# Patient Record
Sex: Male | Born: 1955 | Race: White | Hispanic: No | Marital: Married | Smoking: Light tobacco smoker
Health system: Southern US, Community
[De-identification: ages and names within clinical notes are randomized; demographics above are authoritative.]

## PROBLEM LIST (undated history)

## (undated) DIAGNOSIS — J302 Other seasonal allergic rhinitis: Secondary | ICD-10-CM

## (undated) DIAGNOSIS — K219 Gastro-esophageal reflux disease without esophagitis: Secondary | ICD-10-CM

## (undated) DIAGNOSIS — E785 Hyperlipidemia, unspecified: Secondary | ICD-10-CM

## (undated) HISTORY — DX: Hyperlipidemia, unspecified: E78.5

## (undated) HISTORY — DX: Other seasonal allergic rhinitis: J30.2

## (undated) HISTORY — PX: NASAL SINUS SURGERY: SHX719

---

## 1985-10-30 HISTORY — PX: NASAL SINUS SURGERY: SHX719

## 2005-07-26 ENCOUNTER — Ambulatory Visit (HOSPITAL_COMMUNITY): Admission: RE | Admit: 2005-07-26 | Discharge: 2005-07-26 | Payer: Self-pay | Admitting: Gastroenterology

## 2016-04-20 ENCOUNTER — Ambulatory Visit: Payer: Self-pay | Admitting: Podiatry

## 2016-08-09 ENCOUNTER — Other Ambulatory Visit: Payer: Self-pay | Admitting: Gastroenterology

## 2016-09-06 ENCOUNTER — Encounter (HOSPITAL_COMMUNITY): Payer: Self-pay | Admitting: *Deleted

## 2016-09-12 ENCOUNTER — Encounter (HOSPITAL_COMMUNITY)
Admission: RE | Disposition: A | Discharge status: Home / Self Care | Payer: Self-pay | Source: Ambulatory Visit | Attending: Gastroenterology

## 2016-09-12 ENCOUNTER — Ambulatory Visit (HOSPITAL_COMMUNITY)
Admission: RE | Admit: 2016-09-12 | Discharge: 2016-09-12 | Disposition: A | Discharge status: Home / Self Care | Payer: BLUE CROSS/BLUE SHIELD | Source: Ambulatory Visit | Attending: Gastroenterology | Admitting: Gastroenterology

## 2016-09-12 ENCOUNTER — Ambulatory Visit (HOSPITAL_COMMUNITY): Payer: BLUE CROSS/BLUE SHIELD | Admitting: Anesthesiology

## 2016-09-12 ENCOUNTER — Encounter (HOSPITAL_COMMUNITY): Payer: Self-pay | Admitting: *Deleted

## 2016-09-12 DIAGNOSIS — E7800 Pure hypercholesterolemia, unspecified: Secondary | ICD-10-CM | POA: Insufficient documentation

## 2016-09-12 DIAGNOSIS — K5730 Diverticulosis of large intestine without perforation or abscess without bleeding: Secondary | ICD-10-CM | POA: Diagnosis not present

## 2016-09-12 DIAGNOSIS — Z1211 Encounter for screening for malignant neoplasm of colon: Secondary | ICD-10-CM | POA: Insufficient documentation

## 2016-09-12 DIAGNOSIS — K219 Gastro-esophageal reflux disease without esophagitis: Secondary | ICD-10-CM | POA: Diagnosis not present

## 2016-09-12 DIAGNOSIS — D122 Benign neoplasm of ascending colon: Secondary | ICD-10-CM | POA: Diagnosis not present

## 2016-09-12 HISTORY — PX: COLONOSCOPY WITH PROPOFOL: SHX5780

## 2016-09-12 HISTORY — DX: Gastro-esophageal reflux disease without esophagitis: K21.9

## 2016-09-12 SURGERY — COLONOSCOPY WITH PROPOFOL
Anesthesia: Monitor Anesthesia Care

## 2016-09-12 MED ORDER — PROPOFOL 10 MG/ML IV BOLUS
INTRAVENOUS | Status: AC
  Filled 2016-09-12: qty 40

## 2016-09-12 MED ORDER — LACTATED RINGERS IV SOLN
INTRAVENOUS | Status: DC
  Administered 2016-09-12: 1000 mL via INTRAVENOUS

## 2016-09-12 MED ORDER — LIDOCAINE 2% (20 MG/ML) 5 ML SYRINGE
INTRAMUSCULAR | Status: AC
  Filled 2016-09-12: qty 5

## 2016-09-12 MED ORDER — LIDOCAINE 2% (20 MG/ML) 5 ML SYRINGE
INTRAMUSCULAR | Status: DC | PRN
  Administered 2016-09-12: 60 mg via INTRAVENOUS

## 2016-09-12 MED ORDER — PROPOFOL 500 MG/50ML IV EMUL
INTRAVENOUS | Status: DC | PRN
  Administered 2016-09-12: 150 ug/kg/min via INTRAVENOUS

## 2016-09-12 MED ORDER — SODIUM CHLORIDE 0.9 % IV SOLN: INTRAVENOUS | Status: DC

## 2016-09-12 MED ORDER — PROPOFOL 10 MG/ML IV BOLUS
INTRAVENOUS | Status: DC | PRN
  Administered 2016-09-12 (×2): 30 mg via INTRAVENOUS

## 2016-09-12 SURGICAL SUPPLY — 22 items

## 2016-09-12 NOTE — H&P (Signed)
Procedure: Screening colonoscopy. Normal baseline screening colonoscopy was performed on 07/26/2005  History: The patient is a 60 year old male born August 20, 1956. He is scheduled to undergo a repeat screening colonoscopy today  Medication allergies: None  Past medical history: Allergic rhinitis. Hypercholesterolemia. Sinus surgery.  Exam: The patient is alert and lying comfortably on the endoscopy stretcher. Abdomen is soft and nontender to palpation. Cardiac exam reveals a regular rhythm. Lungs are clear to auscultation.  Plan: Proceed with screening colonoscopy

## 2016-09-12 NOTE — Anesthesia Preprocedure Evaluation (Signed)
Anesthesia Evaluation  Patient identified by MRN, date of birth, ID band Patient awake    Reviewed: Allergy & Precautions, NPO status , Patient's Chart, lab work & pertinent test results  Airway Mallampati: II   Neck ROM: full    Dental   Pulmonary Current Smoker,    breath sounds clear to auscultation       Cardiovascular negative cardio ROS   Rhythm:regular Rate:Normal     Neuro/Psych    GI/Hepatic GERD  ,  Endo/Other    Renal/GU      Musculoskeletal   Abdominal   Peds  Hematology   Anesthesia Other Findings   Reproductive/Obstetrics                             Anesthesia Physical Anesthesia Plan  ASA: II  Anesthesia Plan: MAC   Post-op Pain Management:    Induction: Intravenous  Airway Management Planned: Simple Face Mask  Additional Equipment:   Intra-op Plan:   Post-operative Plan:   Informed Consent: I have reviewed the patients History and Physical, chart, labs and discussed the procedure including the risks, benefits and alternatives for the proposed anesthesia with the patient or authorized representative who has indicated his/her understanding and acceptance.     Plan Discussed with: CRNA, Anesthesiologist and Surgeon  Anesthesia Plan Comments:         Anesthesia Quick Evaluation

## 2016-09-12 NOTE — Discharge Instructions (Signed)

## 2016-09-12 NOTE — Anesthesia Postprocedure Evaluation (Signed)
Anesthesia Post Note  Patient: Brendan Erickson  Procedure(s) Performed: Procedure(s) (LRB): COLONOSCOPY WITH PROPOFOL (N/A)  Patient location during evaluation: PACU Anesthesia Type: MAC Level of consciousness: awake and alert Pain management: pain level controlled Vital Signs Assessment: post-procedure vital signs reviewed and stable Respiratory status: spontaneous breathing, nonlabored ventilation, respiratory function stable and patient connected to nasal cannula oxygen Cardiovascular status: stable and blood pressure returned to baseline Anesthetic complications: no    Last Vitals:  Vitals:   09/12/16 1128 09/12/16 1341  BP: (!) 154/68 (!) 107/59  Pulse: (!) 53 (!) 51  Resp: 18 16  Temp: 36.9 C     Last Pain:  Vitals:   09/12/16 1128  TempSrc: Oral                 [name]

## 2016-09-12 NOTE — Transfer of Care (Signed)
Immediate Anesthesia Transfer of Care Note  Patient: Brendan Erickson  Procedure(s) Performed: Procedure(s): COLONOSCOPY WITH PROPOFOL (N/A)  Patient Location: PACU and Endoscopy Unit  Anesthesia Type:MAC  Level of Consciousness: sedated and patient cooperative  Airway & Oxygen Therapy: Patient Spontanous Breathing and Patient connected to face mask oxygen  Post-op Assessment: Report given to RN and Post -op Vital signs reviewed and stable  Post vital signs: Reviewed and stable  Last Vitals:  Vitals:   09/12/16 1128  BP: (!) 154/68  Pulse: (!) 53  Resp: 18  Temp: 36.9 C    Last Pain:  Vitals:   09/12/16 1128  TempSrc: Oral         Complications: No apparent anesthesia complications

## 2016-09-12 NOTE — Op Note (Signed)
[facility] Patient Name: Brendan Erickson Procedure Date: 09/12/2016 MRN: [id number] Attending MD: Garlan Fair , MD Date of Birth: 11-29-1955 CSN: [id number] Age: 60 Admit Type: Outpatient Procedure:                Colonoscopy Indications:              Screening for colorectal malignant neoplasm Providers:                Garlan Fair, MD, Laverta Baltimore RN, RN,                            Ralene Bathe, Technician Referring MD:              Medicines:                Propofol per Anesthesia Complications:            No immediate complications. Estimated Blood Loss:     Estimated blood loss: none. Procedure:                Pre-Anesthesia Assessment:                           - Prior to the procedure, a History and Physical                            was performed, and patient medications and                            allergies were reviewed. The patient's tolerance of                            previous anesthesia was also reviewed. The risks                            and benefits of the procedure and the sedation                            options and risks were discussed with the patient.                            All questions were answered, and informed consent                            was obtained. Prior Anticoagulants: The patient has                            taken aspirin, last dose was 1 day prior to                            procedure. ASA Grade Assessment: II - A patient                            with mild systemic disease. After reviewing the  risks and benefits, the patient was deemed in                            satisfactory condition to undergo the procedure.                           After obtaining informed consent, the colonoscope                            was passed under direct vision. Throughout the                            procedure, the patient's blood pressure, pulse, and   oxygen saturations were monitored continuously. The                            [id number] scope was introduced through                            the anus and advanced to the the cecum, identified                            by appendiceal orifice and ileocecal valve. The                            colonoscopy was performed without difficulty. The                            patient tolerated the procedure well. The quality                            of the bowel preparation was good. The appendiceal                            orifice and the rectum were photographed. Scope In: 1:12:10 PM Scope Out: 1:36:41 PM Scope Withdrawal Time: 0 hours 14 minutes 10 seconds  Total Procedure Duration: 0 hours 24 minutes 31 seconds  Findings:      The perianal and digital rectal examinations were normal.      A 5 mm polyp was found in the proximal ascending colon. The polyp was       sessile. The polyp was removed with a cold snare. Resection and       retrieval were complete.      Multiple small and large-mouthed diverticula were found in the sigmoid       colon and ascending colon.      The exam was otherwise without abnormality. Impression:               - One 5 mm polyp in the proximal ascending colon,                            removed with a cold snare. Resected and retrieved.                           - Diverticulosis in the  sigmoid colon and in the                            ascending colon.                           - The examination was otherwise normal. Moderate Sedation:      N/A- Per Anesthesia Care      N/A- Per Anesthesia Care Recommendation:           - Patient has a contact number available for                            emergencies. The signs and symptoms of potential                            delayed complications were discussed with the                            patient. Return to normal activities tomorrow.                            Written discharge instructions were  provided to the                            patient.                           - Repeat colonoscopy date to be determined after                            pending pathology results are reviewed for                            screening purposes.                           - Continue present medications.                           - Resume previous diet. Procedure Code(s):        --- Professional ---                           [phone], Colonoscopy, flexible; with removal of                            tumor(s), polyp(s), or other lesion(s) by snare                            technique Diagnosis Code(s):        --- Professional ---                           Z12.11, Encounter for screening for malignant  neoplasm of colon                           D12.2, Benign neoplasm of ascending colon                           K57.30, Diverticulosis of large intestine without                            perforation or abscess without bleeding CPT copyright 2016 American Medical Association. All rights reserved. The codes documented in this report are preliminary and upon coder review may  be revised to meet current compliance requirements. Earle Gell, MD Garlan Fair, MD 09/12/2016 1:48:18 PM This report has been signed electronically. Number of Addenda: 0

## 2016-09-12 NOTE — Anesthesia Procedure Notes (Signed)
Procedure Name: MAC Date/Time: 09/12/2016 1:07 PM Performed by: Dione Booze Pre-anesthesia Checklist: Patient identified, Emergency Drugs available, Suction available and Patient being monitored Patient Re-evaluated:Patient Re-evaluated prior to inductionPlacement Confirmation: positive ETCO2

## 2016-09-14 ENCOUNTER — Encounter (HOSPITAL_COMMUNITY): Payer: Self-pay | Admitting: Gastroenterology

## 2017-04-30 DIAGNOSIS — R030 Elevated blood-pressure reading, without diagnosis of hypertension: Secondary | ICD-10-CM | POA: Diagnosis not present

## 2017-04-30 DIAGNOSIS — M25511 Pain in right shoulder: Secondary | ICD-10-CM | POA: Diagnosis not present

## 2017-04-30 DIAGNOSIS — M4722 Other spondylosis with radiculopathy, cervical region: Secondary | ICD-10-CM | POA: Diagnosis not present

## 2017-05-25 ENCOUNTER — Ambulatory Visit (INDEPENDENT_AMBULATORY_CARE_PROVIDER_SITE_OTHER): Payer: BLUE CROSS/BLUE SHIELD | Admitting: Orthopaedic Surgery

## 2017-05-25 ENCOUNTER — Ambulatory Visit (INDEPENDENT_AMBULATORY_CARE_PROVIDER_SITE_OTHER): Payer: Self-pay

## 2017-05-25 DIAGNOSIS — M25511 Pain in right shoulder: Secondary | ICD-10-CM | POA: Diagnosis not present

## 2017-05-25 DIAGNOSIS — G8929 Other chronic pain: Secondary | ICD-10-CM | POA: Diagnosis not present

## 2017-05-25 NOTE — Progress Notes (Signed)
Office Visit Note   Patient: Brendan Erickson           Date of Birth: Mar 22, 1956           MRN: [id number] Visit Date: 05/25/2017              Requested by: Consuella Lose, MD [address] [place], [place] [address] PCP: Reynold Bowen, MD   Assessment & Plan: Visit Diagnoses:  1. Chronic right shoulder pain     Plan: Overall impression is impingement and AC joint arthrosis but I believe that he also has cervical pathology.  His hand symptoms are not c/w his shoulder pathology.  Subacromial injection performed today.  If not better, will need MRI to better evaluate.    Follow-Up Instructions: Return if symptoms worsen or fail to improve.   Orders:  Orders Placed This Encounter  Procedures  . Large Joint Injection/Arthrocentesis  . XR Shoulder Right   No orders of the defined types were placed in this encounter.     Procedures: Large Joint Inj Date/Time: 05/25/2017 4:52 PM Performed by: Leandrew Koyanagi Authorized by: Leandrew Koyanagi   Consent Given by:  Patient Timeout: prior to procedure the correct patient, procedure, and site was verified   Indications:  Pain Location:  Shoulder Site:  R subacromial bursa Prep: patient was prepped and draped in usual sterile fashion   Needle Size:  22 G Approach:  Posterior Ultrasound Guidance: No   Fluoroscopic Guidance: No       Clinical Data: No additional findings.   Subjective: Chief Complaint  Patient presents with  . 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  He's seeing Dr. Kathyrn Sheriff who wants [place] to see him to r/o shoulder pathology.  Pain is worse with tucking in his shirt and reaching across body.  Denies n/t.  Denies injuries.  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    Review of Systems  Constitutional: Negative.   All other systems reviewed and are negative.    Objective: Vital Signs: There were no vitals taken for this  visit.  Physical Exam  Constitutional: He is oriented to person, place, and time. He appears well-developed and well-nourished.  HENT:  Head: Normocephalic and atraumatic.  Eyes: Pupils are equal, round, and reactive to light.  Neck: Neck supple.  Pulmonary/Chest: Effort normal.  Abdominal: Soft.  Musculoskeletal: Normal range of motion.  Neurological: He is alert and oriented to person, place, and time.  Skin: Skin is warm.  Psychiatric: He has a normal mood and affect. His behavior is normal. Judgment and thought content normal.  Nursing note and vitals reviewed.   Ortho Exam Right shoulder - positive Hawkins, Neer, cross adduction - Negative speed, Belly press, bear hug - mild pain and weakness with empty can - AC joint not overly ttp  Specialty Comments:  No specialty comments available.  Imaging: Xr Shoulder Right  Result Date: 05/25/2017 Significant AC joint arthropathy    PMFS History: There are no active problems to display for this patient.  Past Medical History:  Diagnosis Date  . GERD (gastroesophageal reflux disease)     No family history on file.  Past Surgical History:  Procedure Laterality Date  . COLONOSCOPY WITH PROPOFOL N/A 09/12/2016   Procedure: COLONOSCOPY WITH PROPOFOL;  Surgeon: Garlan Fair, MD;  Location: [facility];  Service: Endoscopy;  Laterality: N/A;  .  NASAL SINUS SURGERY     Social History   Occupational History  . Not on file.   Social History Main Topics  . Smoking status: Light Tobacco Smoker    Types: Cigars  . Smokeless tobacco: Never Used  . Alcohol use Yes     Comment: occassionally  . Drug use: No  . Sexual activity: Not on file

## 2017-06-19 DIAGNOSIS — Z0000 Encounter for general adult medical examination without abnormal findings: Secondary | ICD-10-CM | POA: Diagnosis not present

## 2017-06-19 DIAGNOSIS — E784 Other hyperlipidemia: Secondary | ICD-10-CM | POA: Diagnosis not present

## 2017-06-19 DIAGNOSIS — R7301 Impaired fasting glucose: Secondary | ICD-10-CM | POA: Diagnosis not present

## 2017-06-19 DIAGNOSIS — Z125 Encounter for screening for malignant neoplasm of prostate: Secondary | ICD-10-CM | POA: Diagnosis not present

## 2017-06-26 DIAGNOSIS — M25511 Pain in right shoulder: Secondary | ICD-10-CM | POA: Diagnosis not present

## 2017-06-26 DIAGNOSIS — D126 Benign neoplasm of colon, unspecified: Secondary | ICD-10-CM | POA: Diagnosis not present

## 2017-06-26 DIAGNOSIS — R7301 Impaired fasting glucose: Secondary | ICD-10-CM | POA: Diagnosis not present

## 2017-06-26 DIAGNOSIS — E784 Other hyperlipidemia: Secondary | ICD-10-CM | POA: Diagnosis not present

## 2017-06-26 DIAGNOSIS — Z0000 Encounter for general adult medical examination without abnormal findings: Secondary | ICD-10-CM | POA: Diagnosis not present

## 2017-06-26 DIAGNOSIS — Z1389 Encounter for screening for other disorder: Secondary | ICD-10-CM | POA: Diagnosis not present

## 2017-06-28 DIAGNOSIS — Z1212 Encounter for screening for malignant neoplasm of rectum: Secondary | ICD-10-CM | POA: Diagnosis not present

## 2017-08-02 DIAGNOSIS — H2513 Age-related nuclear cataract, bilateral: Secondary | ICD-10-CM | POA: Diagnosis not present

## 2017-11-07 DIAGNOSIS — L820 Inflamed seborrheic keratosis: Secondary | ICD-10-CM | POA: Diagnosis not present

## 2017-11-07 DIAGNOSIS — L219 Seborrheic dermatitis, unspecified: Secondary | ICD-10-CM | POA: Diagnosis not present

## 2017-12-05 DIAGNOSIS — M25511 Pain in right shoulder: Secondary | ICD-10-CM | POA: Diagnosis not present

## 2017-12-12 DIAGNOSIS — M25511 Pain in right shoulder: Secondary | ICD-10-CM | POA: Diagnosis not present

## 2018-07-10 DIAGNOSIS — Z0000 Encounter for general adult medical examination without abnormal findings: Secondary | ICD-10-CM | POA: Diagnosis not present

## 2018-07-10 DIAGNOSIS — R7301 Impaired fasting glucose: Secondary | ICD-10-CM | POA: Diagnosis not present

## 2018-07-10 DIAGNOSIS — Z125 Encounter for screening for malignant neoplasm of prostate: Secondary | ICD-10-CM | POA: Diagnosis not present

## 2018-07-25 DIAGNOSIS — Z1212 Encounter for screening for malignant neoplasm of rectum: Secondary | ICD-10-CM | POA: Diagnosis not present

## 2018-08-23 DIAGNOSIS — E7849 Other hyperlipidemia: Secondary | ICD-10-CM | POA: Diagnosis not present

## 2018-08-23 DIAGNOSIS — Z0000 Encounter for general adult medical examination without abnormal findings: Secondary | ICD-10-CM | POA: Diagnosis not present

## 2018-08-23 DIAGNOSIS — R7301 Impaired fasting glucose: Secondary | ICD-10-CM | POA: Diagnosis not present

## 2018-08-23 DIAGNOSIS — D126 Benign neoplasm of colon, unspecified: Secondary | ICD-10-CM | POA: Diagnosis not present

## 2018-08-23 DIAGNOSIS — R972 Elevated prostate specific antigen [PSA]: Secondary | ICD-10-CM | POA: Diagnosis not present

## 2018-08-23 DIAGNOSIS — Z23 Encounter for immunization: Secondary | ICD-10-CM | POA: Diagnosis not present

## 2018-11-26 DIAGNOSIS — H2513 Age-related nuclear cataract, bilateral: Secondary | ICD-10-CM | POA: Diagnosis not present

## 2018-12-24 DIAGNOSIS — L821 Other seborrheic keratosis: Secondary | ICD-10-CM | POA: Diagnosis not present

## 2018-12-24 DIAGNOSIS — L570 Actinic keratosis: Secondary | ICD-10-CM | POA: Diagnosis not present

## 2018-12-24 DIAGNOSIS — D485 Neoplasm of uncertain behavior of skin: Secondary | ICD-10-CM | POA: Diagnosis not present

## 2018-12-27 DIAGNOSIS — L821 Other seborrheic keratosis: Secondary | ICD-10-CM | POA: Diagnosis not present

## 2019-01-06 DIAGNOSIS — R350 Frequency of micturition: Secondary | ICD-10-CM | POA: Diagnosis not present

## 2019-01-06 DIAGNOSIS — R3912 Poor urinary stream: Secondary | ICD-10-CM | POA: Diagnosis not present

## 2019-01-06 DIAGNOSIS — N401 Enlarged prostate with lower urinary tract symptoms: Secondary | ICD-10-CM | POA: Diagnosis not present

## 2019-08-19 DIAGNOSIS — Z1212 Encounter for screening for malignant neoplasm of rectum: Secondary | ICD-10-CM | POA: Diagnosis not present

## 2019-08-20 DIAGNOSIS — E7849 Other hyperlipidemia: Secondary | ICD-10-CM | POA: Diagnosis not present

## 2019-08-20 DIAGNOSIS — Z125 Encounter for screening for malignant neoplasm of prostate: Secondary | ICD-10-CM | POA: Diagnosis not present

## 2019-08-20 DIAGNOSIS — R82998 Other abnormal findings in urine: Secondary | ICD-10-CM | POA: Diagnosis not present

## 2019-08-20 DIAGNOSIS — Z0000 Encounter for general adult medical examination without abnormal findings: Secondary | ICD-10-CM | POA: Diagnosis not present

## 2019-08-20 DIAGNOSIS — R7301 Impaired fasting glucose: Secondary | ICD-10-CM | POA: Diagnosis not present

## 2019-08-20 DIAGNOSIS — Z23 Encounter for immunization: Secondary | ICD-10-CM | POA: Diagnosis not present

## 2019-08-27 DIAGNOSIS — K219 Gastro-esophageal reflux disease without esophagitis: Secondary | ICD-10-CM | POA: Diagnosis not present

## 2019-08-27 DIAGNOSIS — Z1331 Encounter for screening for depression: Secondary | ICD-10-CM | POA: Diagnosis not present

## 2019-08-27 DIAGNOSIS — E785 Hyperlipidemia, unspecified: Secondary | ICD-10-CM | POA: Diagnosis not present

## 2019-08-27 DIAGNOSIS — R972 Elevated prostate specific antigen [PSA]: Secondary | ICD-10-CM | POA: Diagnosis not present

## 2019-08-27 DIAGNOSIS — Z0000 Encounter for general adult medical examination without abnormal findings: Secondary | ICD-10-CM | POA: Diagnosis not present

## 2019-08-27 DIAGNOSIS — R7301 Impaired fasting glucose: Secondary | ICD-10-CM | POA: Diagnosis not present

## 2019-10-18 DIAGNOSIS — Z20828 Contact with and (suspected) exposure to other viral communicable diseases: Secondary | ICD-10-CM | POA: Diagnosis not present

## 2019-12-08 DIAGNOSIS — H2513 Age-related nuclear cataract, bilateral: Secondary | ICD-10-CM | POA: Diagnosis not present

## 2019-12-30 DIAGNOSIS — L820 Inflamed seborrheic keratosis: Secondary | ICD-10-CM | POA: Diagnosis not present

## 2019-12-30 DIAGNOSIS — L578 Other skin changes due to chronic exposure to nonionizing radiation: Secondary | ICD-10-CM | POA: Diagnosis not present

## 2019-12-30 DIAGNOSIS — L570 Actinic keratosis: Secondary | ICD-10-CM | POA: Diagnosis not present

## 2019-12-30 DIAGNOSIS — W908XXS Exposure to other nonionizing radiation, sequela: Secondary | ICD-10-CM | POA: Diagnosis not present

## 2020-01-03 DIAGNOSIS — Z23 Encounter for immunization: Secondary | ICD-10-CM | POA: Diagnosis not present

## 2020-01-14 DIAGNOSIS — R3912 Poor urinary stream: Secondary | ICD-10-CM | POA: Diagnosis not present

## 2020-01-14 DIAGNOSIS — N401 Enlarged prostate with lower urinary tract symptoms: Secondary | ICD-10-CM | POA: Diagnosis not present

## 2020-01-22 ENCOUNTER — Encounter: Payer: Self-pay | Admitting: Gastroenterology

## 2020-01-28 ENCOUNTER — Ambulatory Visit: Payer: BC Managed Care – PPO | Admitting: Gastroenterology

## 2020-01-28 ENCOUNTER — Encounter: Payer: Self-pay | Admitting: Gastroenterology

## 2020-01-28 ENCOUNTER — Other Ambulatory Visit (INDEPENDENT_AMBULATORY_CARE_PROVIDER_SITE_OTHER): Payer: BC Managed Care – PPO

## 2020-01-28 VITALS — BP 138/64 | HR 70 | Temp 98.4°F | Ht 70.0 in | Wt 166.0 lb

## 2020-01-28 DIAGNOSIS — K5900 Constipation, unspecified: Secondary | ICD-10-CM | POA: Diagnosis not present

## 2020-01-28 DIAGNOSIS — R1084 Generalized abdominal pain: Secondary | ICD-10-CM | POA: Diagnosis not present

## 2020-01-28 DIAGNOSIS — R140 Abdominal distension (gaseous): Secondary | ICD-10-CM

## 2020-01-28 LAB — BASIC METABOLIC PANEL
BUN: 15 mg/dL (ref 6–23)
CO2: 29 mEq/L (ref 19–32)
Calcium: 9.3 mg/dL (ref 8.4–10.5)
Chloride: 104 mEq/L (ref 96–112)
Creatinine, Ser: 0.97 mg/dL (ref 0.40–1.50)
GFR: 77.68 mL/min (ref >60.00)
Glucose, Bld: 104 mg/dL — ABNORMAL HIGH (ref 70–99)
Potassium: 4.4 mEq/L (ref 3.5–5.1)
Sodium: 140 mEq/L (ref 135–145)

## 2020-01-28 MED ORDER — HYOSCYAMINE SULFATE 0.125 MG SL SUBL: 0.1250 mg | SUBLINGUAL_TABLET | Freq: Four times a day (QID) | SUBLINGUAL | 1 refills | Status: DC | PRN

## 2020-01-28 NOTE — Progress Notes (Addendum)
01/28/2020 Brendan Erickson [id number] 1956/02/05   HISTORY OF PRESENT ILLNESS: This is a pleasant 64 year old male who is new to our office.  He has been referred here by his PCP, Dr. Forde Dandy, for evaluation regarding abdominal pain.  He tells me that since the beginning of year he has had "waves of digestive issues".  He reports lower abdominal and lower back pain.  He says that sometimes the abdominal pain will wake him between 12 and 2 AM.  He says that this has occurred about 3 times, so about once a month since the beginning of the year.  He reports constipation.  Says that at this point he is only using laxatives as needed.  Has tried Metamucil and probiotics in the past.  Reports generalized abdominal bloating.  Has taken Beano in the past.  Denies nausea, vomiting, fevers, chills.  He had colonoscopy by [facility] last year that he reports was normal except a couple of polyps removed.  Looks like he also had a colonoscopy in 2017 by Dr. Wynetta Emery at which time he had a polyp removed and also had diverticulosis.   Past Medical History:  Diagnosis Date  . GERD (gastroesophageal reflux disease)    Past Surgical History:  Procedure Laterality Date  . COLONOSCOPY WITH PROPOFOL N/A 09/12/2016   Procedure: COLONOSCOPY WITH PROPOFOL;  Surgeon: Garlan Fair, MD;  Location: [facility];  Service: Endoscopy;  Laterality: N/A;  . NASAL SINUS SURGERY      reports that he has been smoking cigars. He has never used smokeless tobacco. He reports current alcohol use. He reports that he does not use drugs. family history includes Diverticulitis in his father; Prostate cancer in his father. No Known Allergies    Outpatient Encounter Medications as of 01/28/2020  Medication Sig  . rosuvastatin (CRESTOR) 10 MG tablet Take 10 mg by mouth once a week.  . [DISCONTINUED] fluticasone (FLONASE) 50 MCG/ACT nasal spray Place 1-2 sprays into both nostrils daily as needed for allergies or rhinitis.  .  [DISCONTINUED] phenylephrine (SUDAFED PE) 10 MG TABS tablet Take 20 mg by mouth every 8 (eight) hours as needed (sinus headaches).  . [DISCONTINUED] Pseudoeph-Doxylamine-DM-APAP (NYQUIL PO) Take 1 Dose by mouth at bedtime as needed (cold symptoms).   No facility-administered encounter medications on file as of 01/28/2020.     REVIEW OF SYSTEMS  : All other systems reviewed and negative except where noted in the History of Present Illness.   PHYSICAL EXAM: BP 138/64   Pulse 70   Temp 98.4 F (36.9 C)   Ht 5\' 10"  (1.778 m)   Wt 166 lb (75.3 kg)   BMI 23.82 kg/m  General: Well developed white male in no acute distress Head: Normocephalic and atraumatic Eyes:  Sclerae anicteric, conjunctiva pink. Ears: Normal auditory acuity  Lungs: Clear throughout to auscultation; no increased WOB. Heart: Regular rate and rhythm; no M/R/G. Abdomen: Soft, non-distended.  BS present. Non-tender. Musculoskeletal: Symmetrical with no gross deformities  Skin: No lesions on visible extremities Extremities: No edema  Neurological: Alert oriented x 4, grossly non-focal Psychological:  Alert and cooperative. Normal mood and affect  ASSESSMENT AND PLAN: *Lower abdominal pain with associated bloating: Pain wakes him up from sleep at night intermittently.  Has constipation as well.  Likely all related.  Had colonoscopy last year at [facility].  Will try to obtain those results.  Will plan for CT scan of the abdomen/pelvis with contrast.  Pending that is normal we are  going to plan to have him take some type of bowel purge either with magnesium citrate or MiraLAX this evening and then begin taking MiraLAX daily, increasing to twice daily if needed to treat the underlying constipation.  He recently had labs by his PCP in October 2020 so we will request those.  We will give him a prescription for Levsin to use for abdominal cramping on an as-needed basis.  **Received colonoscopy results from [facility].  Last colonoscopy  November 2017 by Dr. Earle Gell at which time he was found to have one 5 mm polyp removed from the proximal ascending colon.  Also found to have diverticulosis in the sigmoid and ascending colon.  Pathology revealed a tubular adenoma.   CC:  Reynold Bowen, MD

## 2020-01-28 NOTE — Patient Instructions (Addendum)
Your provider has requested that you go to the basement level for lab work before leaving today. Press "B" on the elevator. The lab is located at the first door on the left as you exit the elevator.   Take one capful daily in 8 ounces of liquid, coupon provided.   We have sent the following medications to your pharmacy for you to pick up at your convenience: levsin   You have been scheduled for a CT scan of the abdomen and pelvis at [facility] ([address]---this is in the same building as [employer]).   You are scheduled on __4/7/2021__ at ___1:30__. You should arrive 15 minutes prior to your appointment time for registration. Please follow the written instructions below on the day of your exam:  WARNING: IF YOU ARE ALLERGIC TO IODINE/X-RAY DYE, PLEASE NOTIFY RADIOLOGY IMMEDIATELY AT [phone]! YOU WILL BE GIVEN A 13 HOUR PREMEDICATION PREP.  1) Do not eat or drink anything after ___9:30am___ (4 hours prior to your test) 2) You have been given 2 bottles of oral contrast to drink. The solution may taste better if refrigerated, but do NOT add ice or any other liquid to this solution. Shake well before drinking.    Drink 1 bottle of contrast @ ____11:30AM____ (2 hours prior to your exam)  Drink 1 bottle of contrast @ ____12:30PM____ (1 hour prior to your exam)  You may take any medications as prescribed with a small amount of water, if necessary. If you take any of the following medications: METFORMIN, GLUCOPHAGE, GLUCOVANCE, AVANDAMET, RIOMET, FORTAMET, [place] MET, JANUMET, GLUMETZA or METAGLIP, you MAY be asked to HOLD this medication 48 hours AFTER the exam.  The purpose of you drinking the oral contrast is to aid in the visualization of your intestinal tract. The contrast solution may cause some diarrhea. Depending on your individual set of symptoms, you may also receive an intravenous injection of x-ray contrast/dye. Plan on being at [facility]  for 30 minutes or longer, depending on the type of exam you are having performed.  This test typically takes 30-45 minutes to complete.  If you have any questions regarding your exam or if you need to reschedule, you may call the CT department at [phone] between the hours of 8:00 am and 5:00 pm, Monday-Friday.  ________________________________________________________________________    I appreciate the opportunity to care for you. Alonza Bogus, PA-C

## 2020-01-28 NOTE — Progress Notes (Signed)
Reviewed and agree with management plan.  Torii Royse T. Nekisha Mcdiarmid, MD FACG [facility]  

## 2020-02-03 DIAGNOSIS — Z23 Encounter for immunization: Secondary | ICD-10-CM | POA: Diagnosis not present

## 2020-02-04 ENCOUNTER — Other Ambulatory Visit: Payer: Self-pay

## 2020-02-04 ENCOUNTER — Ambulatory Visit (INDEPENDENT_AMBULATORY_CARE_PROVIDER_SITE_OTHER)
Admission: RE | Admit: 2020-02-04 | Discharge: 2020-02-04 | Disposition: A | Discharge status: Home / Self Care | Payer: BC Managed Care – PPO | Source: Ambulatory Visit | Attending: Gastroenterology | Admitting: Gastroenterology

## 2020-02-04 DIAGNOSIS — R1084 Generalized abdominal pain: Secondary | ICD-10-CM

## 2020-02-04 DIAGNOSIS — R109 Unspecified abdominal pain: Secondary | ICD-10-CM | POA: Diagnosis not present

## 2020-02-04 DIAGNOSIS — R140 Abdominal distension (gaseous): Secondary | ICD-10-CM

## 2020-02-04 DIAGNOSIS — K5900 Constipation, unspecified: Secondary | ICD-10-CM | POA: Diagnosis not present

## 2020-02-04 MED ORDER — IOHEXOL 300 MG/ML  SOLN
100.0000 mL | Freq: Once | INTRAMUSCULAR | Status: AC | PRN
  Administered 2020-02-04: 14:00:00 100 mL via INTRAVENOUS

## 2020-08-20 DIAGNOSIS — Z0000 Encounter for general adult medical examination without abnormal findings: Secondary | ICD-10-CM | POA: Diagnosis not present

## 2020-08-20 DIAGNOSIS — E785 Hyperlipidemia, unspecified: Secondary | ICD-10-CM | POA: Diagnosis not present

## 2020-08-20 DIAGNOSIS — R7301 Impaired fasting glucose: Secondary | ICD-10-CM | POA: Diagnosis not present

## 2020-08-20 DIAGNOSIS — Z125 Encounter for screening for malignant neoplasm of prostate: Secondary | ICD-10-CM | POA: Diagnosis not present

## 2020-08-27 DIAGNOSIS — Z23 Encounter for immunization: Secondary | ICD-10-CM | POA: Diagnosis not present

## 2020-08-27 DIAGNOSIS — E785 Hyperlipidemia, unspecified: Secondary | ICD-10-CM | POA: Diagnosis not present

## 2020-08-27 DIAGNOSIS — R82998 Other abnormal findings in urine: Secondary | ICD-10-CM | POA: Diagnosis not present

## 2020-08-27 DIAGNOSIS — R8290 Unspecified abnormal findings in urine: Secondary | ICD-10-CM | POA: Diagnosis not present

## 2020-08-27 DIAGNOSIS — Z0000 Encounter for general adult medical examination without abnormal findings: Secondary | ICD-10-CM | POA: Diagnosis not present

## 2020-09-05 DIAGNOSIS — Z20822 Contact with and (suspected) exposure to covid-19: Secondary | ICD-10-CM | POA: Diagnosis not present

## 2020-10-05 DIAGNOSIS — Z1212 Encounter for screening for malignant neoplasm of rectum: Secondary | ICD-10-CM | POA: Diagnosis not present

## 2020-11-09 DIAGNOSIS — Z20822 Contact with and (suspected) exposure to covid-19: Secondary | ICD-10-CM | POA: Diagnosis not present

## 2020-12-29 DIAGNOSIS — X32XXXS Exposure to sunlight, sequela: Secondary | ICD-10-CM | POA: Diagnosis not present

## 2020-12-29 DIAGNOSIS — L570 Actinic keratosis: Secondary | ICD-10-CM | POA: Diagnosis not present

## 2020-12-29 DIAGNOSIS — L821 Other seborrheic keratosis: Secondary | ICD-10-CM | POA: Diagnosis not present

## 2020-12-29 DIAGNOSIS — D1801 Hemangioma of skin and subcutaneous tissue: Secondary | ICD-10-CM | POA: Diagnosis not present

## 2020-12-29 DIAGNOSIS — L814 Other melanin hyperpigmentation: Secondary | ICD-10-CM | POA: Diagnosis not present

## 2021-01-11 DIAGNOSIS — Z125 Encounter for screening for malignant neoplasm of prostate: Secondary | ICD-10-CM | POA: Diagnosis not present

## 2021-01-19 DIAGNOSIS — R350 Frequency of micturition: Secondary | ICD-10-CM | POA: Diagnosis not present

## 2021-01-19 DIAGNOSIS — N401 Enlarged prostate with lower urinary tract symptoms: Secondary | ICD-10-CM | POA: Diagnosis not present

## 2021-06-10 IMAGING — CT CT ABD-PELV W/ CM
2 of 5 series · 16 of 46 positions shown, 18 images · IV contrast (OMNIPAQUE 300)
Comparison: None

CLINICAL DATA: Right lower quadrant abdominal pain and bloating

EXAM:
CT ABDOMEN AND PELVIS WITH CONTRAST
TECHNIQUE: Multidetector CT imaging of the abdomen and pelvis was performed
using the standard protocol following bolus administration of
intravenous contrast.
CONTRAST:  100mL OMNIPAQUE IOHEXOL 300 MG/ML  SOLN

[Series 2: abd/pel w · axial · 0.71mm/px · z∈[-517,-92]mm · 13 of 95 slices shown, 15 images]
[im 5/95  soft-tissue]
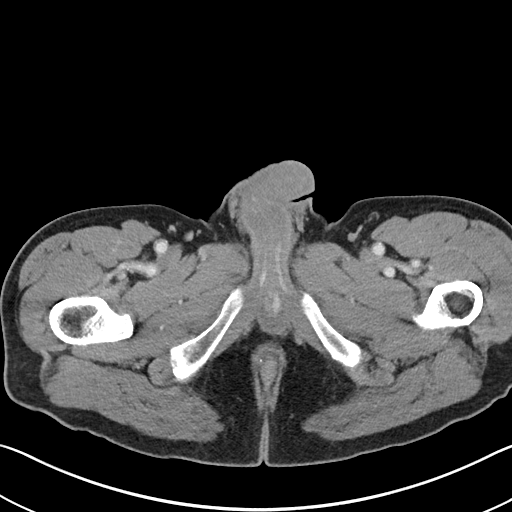
[im 5/95  bone]
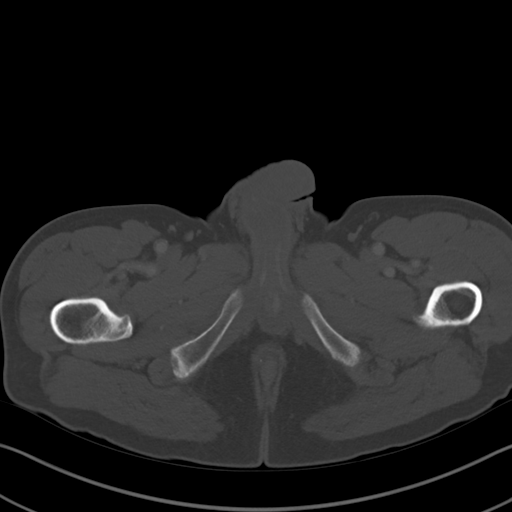
[im 15/95  soft-tissue]
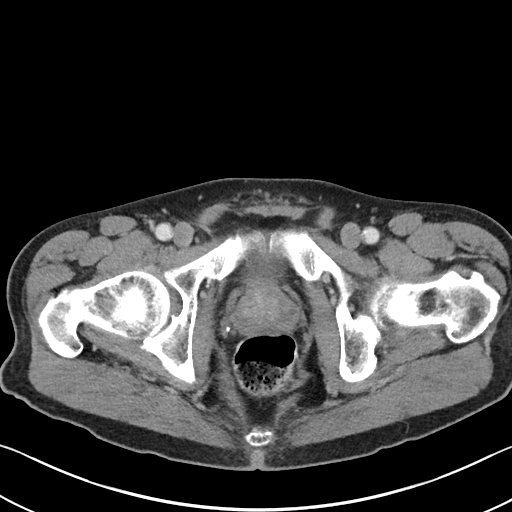
[im 19/95  soft-tissue]
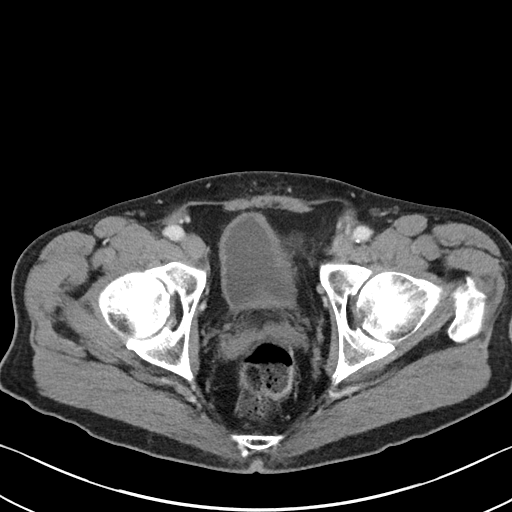
[im 29/95  soft-tissue]
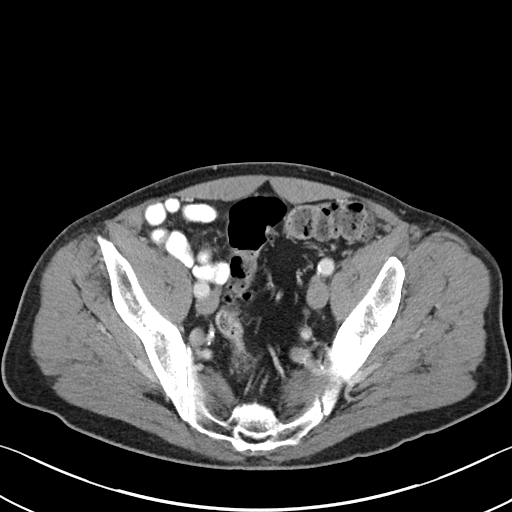
[im 33/95  soft-tissue]
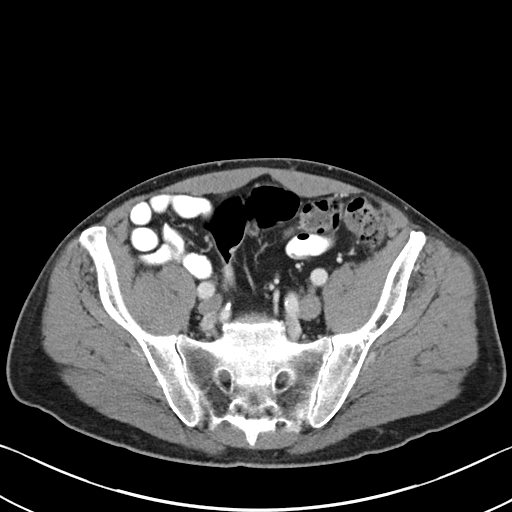
[im 43/95  soft-tissue]
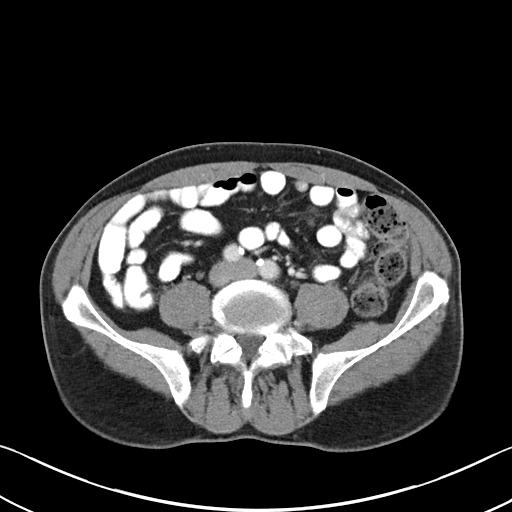
[im 48/95  soft-tissue]
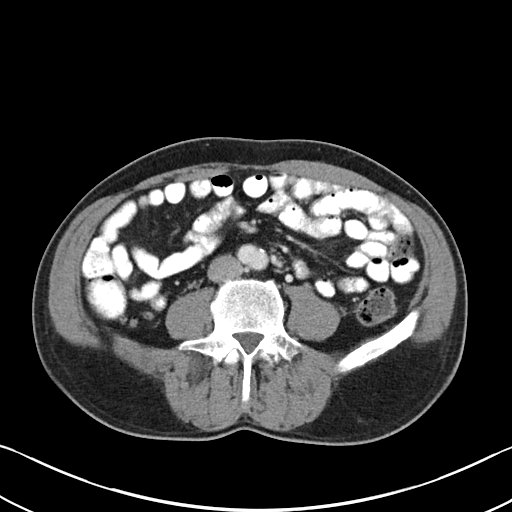
[im 52/95  soft-tissue]
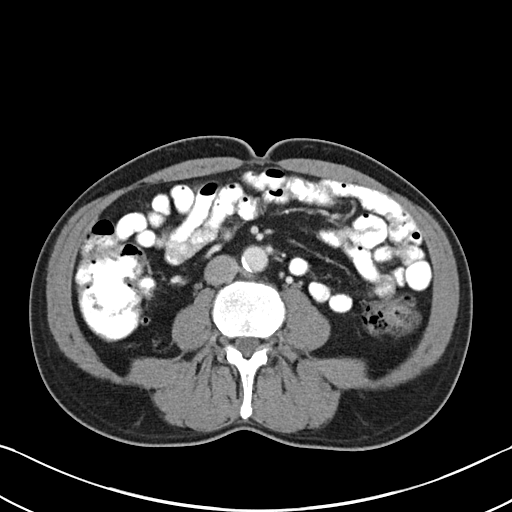
[im 62/95  soft-tissue]
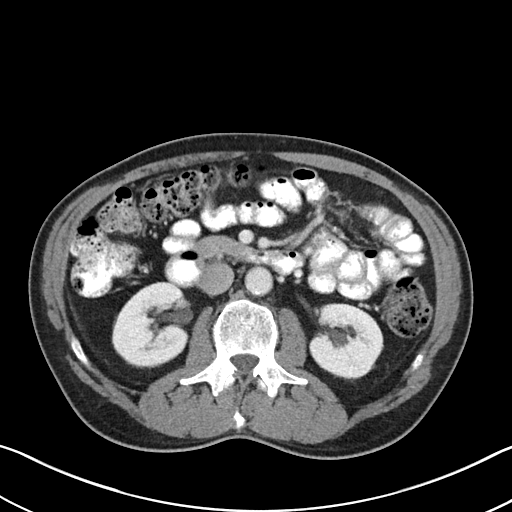
[im 62/95  bone]
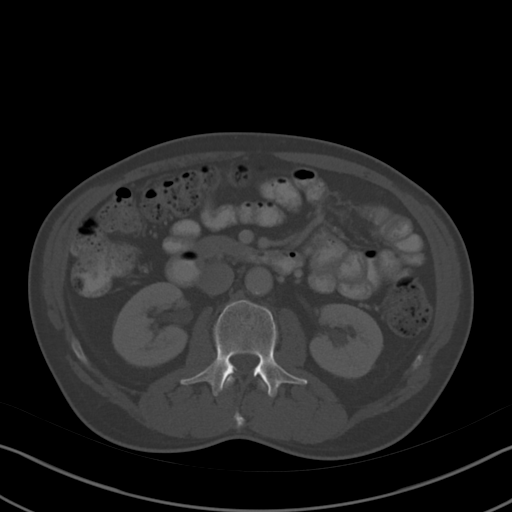
[im 66/95  soft-tissue]
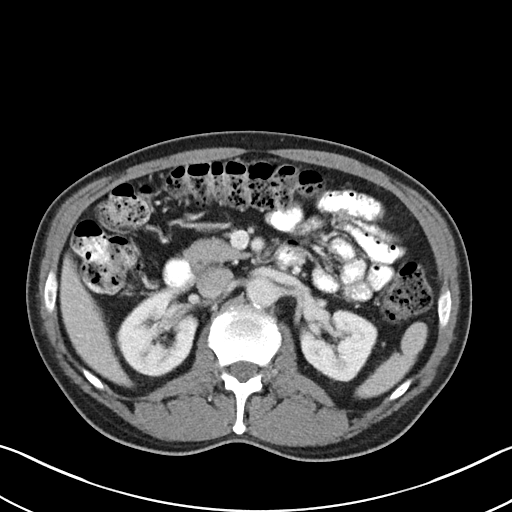
[im 76/95  soft-tissue]
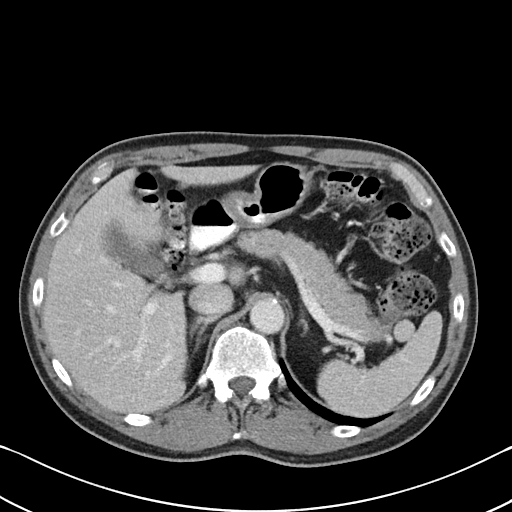
[im 80/95  soft-tissue]
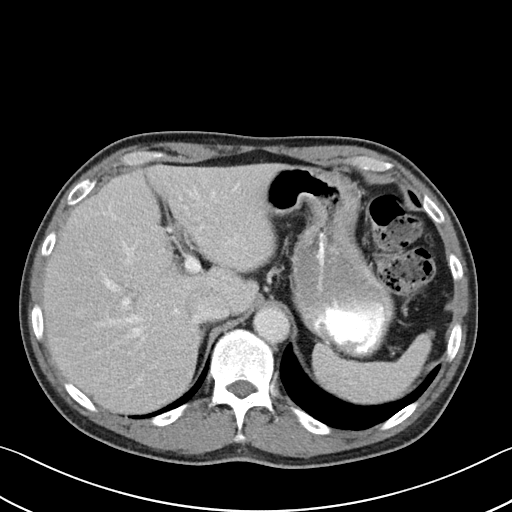
[im 90/95  soft-tissue]
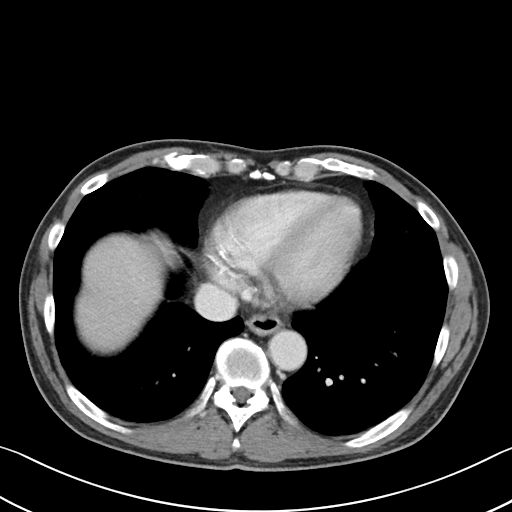

[Series 5: abd/pel w st · coronal · 0.65mm/px · 3 of 75 slices shown]
[im 25/75  soft-tissue]
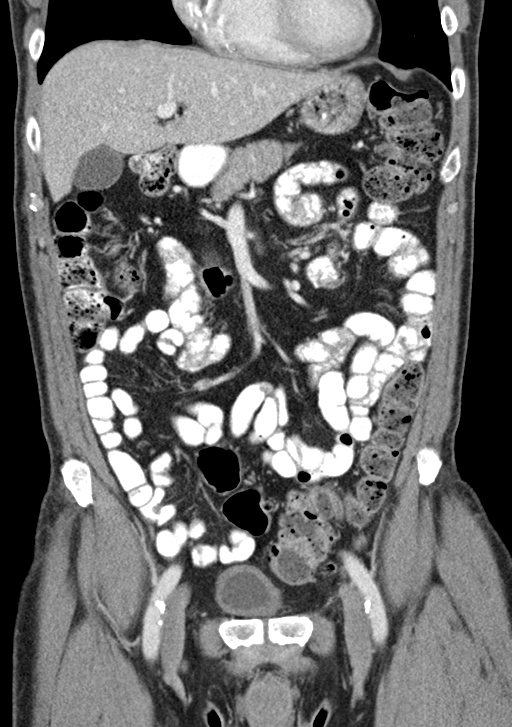
[im 33/75  soft-tissue]
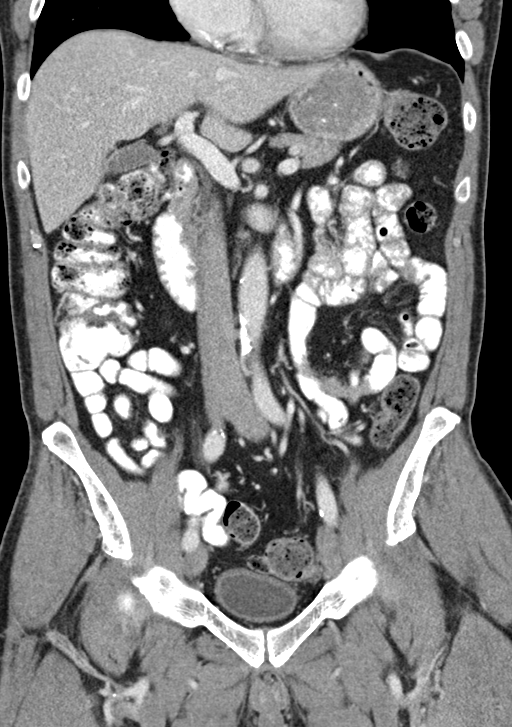
[im 42/75  soft-tissue]
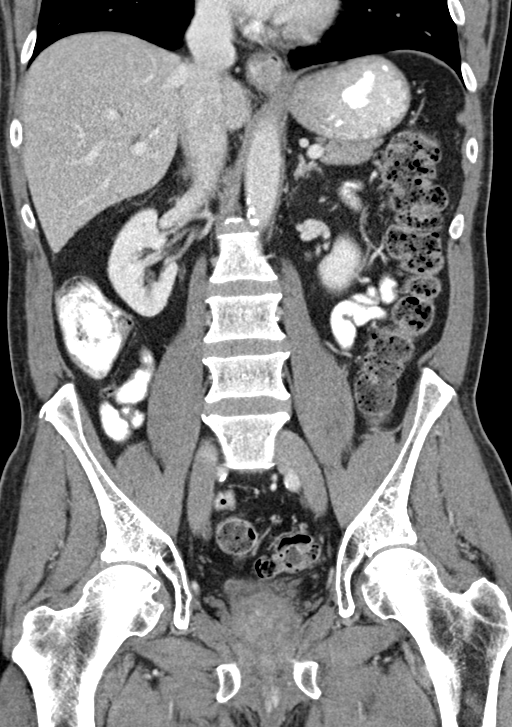

[16 of 46 positions shown; findings below may reference images not displayed]

FINDINGS: Lower chest: No consolidation or pleural effusion. No pericardial
fluid.

Hepatobiliary: Liver is normal. No pericholecystic inflammation or
ductal dilation.

Pancreas: Pancreas is normal without inflammation or ductal
dilation.

Spleen: Spleen is normal size. Small splenule adjacent to the
spleen.

Adrenals/Urinary Tract: Adrenal glands are normal. Renal contours
are smooth and there is no sign of hydronephrosis.

Stomach/Bowel: Moderate distension of the stomach. No acute
gastrointestinal process. The appendix is normal. Abundant stool in
the colon. Colonic diverticulosis without signs of inflammation.

Vascular/Lymphatic: Calcified and noncalcified plaque in the
abdominal aorta. No aneurysm. No adenopathy.

No pelvic lymphadenopathy.

Reproductive: Mild heterogeneity of the prostate, nonspecific
finding on CT.

Other: No abdominal wall hernia or ascites.

Musculoskeletal: No acute bone finding or destructive bone process.
IMPRESSION: 1. Abundant stool in the colon suggests constipation.
2. Colonic diverticulosis without signs of inflammation.
3. Normal appendix.
4. No signs of gallbladder inflammation.

Aortic Atherosclerosis ([id number]).

## 2021-09-06 DIAGNOSIS — Z125 Encounter for screening for malignant neoplasm of prostate: Secondary | ICD-10-CM | POA: Diagnosis not present

## 2021-09-06 DIAGNOSIS — R7301 Impaired fasting glucose: Secondary | ICD-10-CM | POA: Diagnosis not present

## 2021-09-06 DIAGNOSIS — E785 Hyperlipidemia, unspecified: Secondary | ICD-10-CM | POA: Diagnosis not present

## 2021-09-13 DIAGNOSIS — R7301 Impaired fasting glucose: Secondary | ICD-10-CM | POA: Diagnosis not present

## 2021-09-13 DIAGNOSIS — Z1331 Encounter for screening for depression: Secondary | ICD-10-CM | POA: Diagnosis not present

## 2021-09-13 DIAGNOSIS — Z1339 Encounter for screening examination for other mental health and behavioral disorders: Secondary | ICD-10-CM | POA: Diagnosis not present

## 2021-09-13 DIAGNOSIS — Z1212 Encounter for screening for malignant neoplasm of rectum: Secondary | ICD-10-CM | POA: Diagnosis not present

## 2021-09-13 DIAGNOSIS — Z23 Encounter for immunization: Secondary | ICD-10-CM | POA: Diagnosis not present

## 2021-09-13 DIAGNOSIS — Z0000 Encounter for general adult medical examination without abnormal findings: Secondary | ICD-10-CM | POA: Diagnosis not present

## 2021-11-04 ENCOUNTER — Encounter: Payer: Self-pay | Admitting: Gastroenterology

## 2022-01-02 DIAGNOSIS — L814 Other melanin hyperpigmentation: Secondary | ICD-10-CM | POA: Diagnosis not present

## 2022-01-02 DIAGNOSIS — X32XXXS Exposure to sunlight, sequela: Secondary | ICD-10-CM | POA: Diagnosis not present

## 2022-01-02 DIAGNOSIS — L821 Other seborrheic keratosis: Secondary | ICD-10-CM | POA: Diagnosis not present

## 2022-01-02 DIAGNOSIS — L738 Other specified follicular disorders: Secondary | ICD-10-CM | POA: Diagnosis not present

## 2022-01-02 DIAGNOSIS — L570 Actinic keratosis: Secondary | ICD-10-CM | POA: Diagnosis not present

## 2022-01-09 ENCOUNTER — Telehealth: Payer: Self-pay | Admitting: Gastroenterology

## 2022-01-09 NOTE — Telephone Encounter (Signed)
Good morning Dr. Fuller Plan, ? ?This patient is on recall with you for a colonoscopy; however, his last colonoscopy was performed at [facility] in 2017 by Dr. Earle Gell (procedure report in Epic).  Patient has not seen Dr. Wynetta Emery since the procedure in 2017 and received our recall letter.  He prefers to come here for his procedure with you.  Let me know if you approve the transfer back.   ? ?Thank you. ?

## 2022-01-09 NOTE — Telephone Encounter (Signed)
He reestablished with [facility] at his 01/28/2020 office visit. Please schedule his colonoscopy with me.  ?

## 2022-01-10 ENCOUNTER — Encounter: Payer: Self-pay | Admitting: Gastroenterology

## 2022-01-30 ENCOUNTER — Other Ambulatory Visit: Payer: Self-pay

## 2022-01-30 ENCOUNTER — Ambulatory Visit (AMBULATORY_SURGERY_CENTER): Payer: BC Managed Care – PPO

## 2022-01-30 VITALS — Ht 70.0 in | Wt 167.0 lb

## 2022-01-30 DIAGNOSIS — Z86010 Personal history of colonic polyps: Secondary | ICD-10-CM

## 2022-01-30 MED ORDER — NA SULFATE-K SULFATE-MG SULF 17.5-3.13-1.6 GM/177ML PO SOLN: 1.0000 | Freq: Once | ORAL | 0 refills | Status: AC

## 2022-01-30 NOTE — Progress Notes (Signed)
No egg or soy allergy known to patient  ?No issues known to pt with past sedation with any surgeries or procedures ?Patient denies ever being told they had issues or difficulty with intubation  ?No FH of Malignant Hyperthermia ?Pt is not on diet pills ?Pt is not on home 02  ?Pt is not on blood thinners  ?Pt reports issues with constipation-patient reports random constipation unknown cause; patient advised to increase po fluids, activity,fresh fruits/veggies; ?No A fib or A flutter ?Patient advised to use SingleCare or Good Rx for prep cost reduction; ?NO PA's for preps discussed with pt in PV today  ?Discussed with pt there will be an out-of-pocket cost for prep and that varies from $0 to 70 + dollars - pt verbalized understanding  ?Due to the COVID-19 pandemic we are asking patients to follow certain guidelines in PV and the [place]   ?Pt aware of COVID protocols and [facility] guidelines  ?PV completed over the phone. Pt verified name, DOB, address and insurance during PV today.  ?Pt mailed instruction packet with copy of consent form to read and not return, and instructions.  ?Pt encouraged to call with questions or issues.  ?If pt has My chart, procedure instructions sent via My Chart  ? ?

## 2022-02-06 DIAGNOSIS — H5212 Myopia, left eye: Secondary | ICD-10-CM | POA: Diagnosis not present

## 2022-02-06 DIAGNOSIS — H2513 Age-related nuclear cataract, bilateral: Secondary | ICD-10-CM | POA: Diagnosis not present

## 2022-02-27 ENCOUNTER — Encounter: Payer: Self-pay | Admitting: Gastroenterology

## 2022-03-07 ENCOUNTER — Ambulatory Visit (AMBULATORY_SURGERY_CENTER): Payer: BC Managed Care – PPO | Admitting: Gastroenterology

## 2022-03-07 ENCOUNTER — Encounter: Payer: Self-pay | Admitting: Gastroenterology

## 2022-03-07 VITALS — BP 123/72 | HR 58 | Temp 96.8°F | Resp 12 | Ht 70.0 in | Wt 167.0 lb

## 2022-03-07 DIAGNOSIS — D124 Benign neoplasm of descending colon: Secondary | ICD-10-CM

## 2022-03-07 DIAGNOSIS — Z1211 Encounter for screening for malignant neoplasm of colon: Secondary | ICD-10-CM | POA: Diagnosis not present

## 2022-03-07 DIAGNOSIS — D128 Benign neoplasm of rectum: Secondary | ICD-10-CM

## 2022-03-07 DIAGNOSIS — D125 Benign neoplasm of sigmoid colon: Secondary | ICD-10-CM | POA: Diagnosis not present

## 2022-03-07 DIAGNOSIS — Z86010 Personal history of colonic polyps: Secondary | ICD-10-CM | POA: Diagnosis not present

## 2022-03-07 DIAGNOSIS — D123 Benign neoplasm of transverse colon: Secondary | ICD-10-CM | POA: Diagnosis not present

## 2022-03-07 DIAGNOSIS — Z8371 Family history of colonic polyps: Secondary | ICD-10-CM | POA: Diagnosis not present

## 2022-03-07 DIAGNOSIS — D127 Benign neoplasm of rectosigmoid junction: Secondary | ICD-10-CM | POA: Diagnosis not present

## 2022-03-07 MED ORDER — SODIUM CHLORIDE 0.9 % IV SOLN: 500.0000 mL | Freq: Once | INTRAVENOUS | Status: DC

## 2022-03-07 NOTE — Progress Notes (Signed)
PT taken to PACU. Monitors in place. VSS. Report given to RN. 

## 2022-03-07 NOTE — Op Note (Addendum)
[place] ?Patient Name: Brendan Erickson ?Procedure Date: 03/07/2022 8:23 AM ?MRN: [id number] ?Endoscopist: Ladene Artist , MD ?Age: 66 ?Referring MD:  ?Date of Birth: Feb 15, 1956 ?Gender: Male ?Account #: [id number] ?Procedure:                Colonoscopy ?Indications:              Surveillance: Personal history of adenomatous  ?                          polyps on last colonoscopy > 5 years ago, Family  ?                          history of colon polyps, 1st-degree relative ?Medicines:                Monitored Anesthesia Care ?Procedure:                Pre-Anesthesia Assessment: ?                          - Prior to the procedure, a History and Physical  ?                          was performed, and patient medications and  ?                          allergies were reviewed. The patient's tolerance of  ?                          previous anesthesia was also reviewed. The risks  ?                          and benefits of the procedure and the sedation  ?                          options and risks were discussed with the patient.  ?                          All questions were answered, and informed consent  ?                          was obtained. Prior Anticoagulants: The patient has  ?                          taken no previous anticoagulant or antiplatelet  ?                          agents. ASA Grade Assessment: II - A patient with  ?                          mild systemic disease. After reviewing the risks  ?                          and benefits, the patient was deemed in  ?  satisfactory condition to undergo the procedure. ?                          After obtaining informed consent, the colonoscope  ?                          was passed under direct vision. Throughout the  ?                          procedure, the patient's blood pressure, pulse, and  ?                          oxygen saturations were monitored continuously. The  ?                          CF [id number] #[id number] was  introduced through the anus  ?                          and advanced to the the cecum, identified by  ?                          appendiceal orifice and ileocecal valve. The  ?                          ileocecal valve, appendiceal orifice, and rectum  ?                          were photographed. The quality of the bowel  ?                          preparation was good. The colonoscopy was performed  ?                          without difficulty. The patient tolerated the  ?                          procedure well. ?Scope In: 8:41:52 AM ?Scope Out: 9:01:31 AM ?Scope Withdrawal Time: 0 hours 16 minutes 42 seconds  ?Total Procedure Duration: 0 hours 19 minutes 39 seconds  ?Findings:                 The perianal and digital rectal examinations were  ?                          normal. ?                          Five sessile polyps were found in the rectum (1),  ?                          sigmoid colon (1), descending colon (2) and  ?                          transverse colon (1). The polyps were 5 to 8 mm in  ?  size. These polyps were removed with a cold snare.  ?                          Resection and retrieval were complete. ?                          Scattered small-mouthed diverticula were found in  ?                          the right colon. There was no evidence of  ?                          diverticular bleeding. ?                          Multiple medium-mouthed diverticula were found in  ?                          the left colon. There was evidence of diverticular  ?                          spasm. There was no evidence of diverticular  ?                          bleeding. ?                          Internal hemorrhoids were found during  ?                          retroflexion. The hemorrhoids were moderate and  ?                          Grade I (internal hemorrhoids that do not prolapse). ?                          The exam was otherwise without abnormality on  ?                           direct and retroflexion views. ?Complications:            No immediate complications. Estimated blood loss:  ?                          None. ?Estimated Blood Loss:     Estimated blood loss: none. ?Impression:               - Five 5 to 8 mm polyps in the rectum, in the  ?                          sigmoid colon, in the descending colon and in the  ?                          transverse colon, removed with a cold snare.  ?  Resected and retrieved. ?                          - Mild diverticulosis in the right colon. ?                          - Moderate diverticulosis in the left colon. ?                          - Internal hemorrhoids. ?                          - The examination was otherwise normal on direct  ?                          and retroflexion views. ?Recommendation:           - Repeat colonoscopy, likely 3-5 years, after  ?                          studies are complete for surveillance based on  ?                          pathology results. ?                          - Patient has a contact number available for  ?                          emergencies. The signs and symptoms of potential  ?                          delayed complications were discussed with the  ?                          patient. Return to normal activities tomorrow.  ?                          Written discharge instructions were provided to the  ?                          patient. ?                          - High fiber diet. ?                          - Continue present medications. ?                          - Await pathology results. ?Ladene Artist, MD ?03/07/2022 9:05:53 AM ?This report has been signed electronically. ?

## 2022-03-07 NOTE — Progress Notes (Signed)
[facility]-DT 

## 2022-03-07 NOTE — Progress Notes (Signed)
Called to room to assist during endoscopic procedure.  Patient ID and intended procedure confirmed with present staff. Received instructions for my participation in the procedure from the performing physician.  

## 2022-03-07 NOTE — Progress Notes (Signed)
? ?History & Physical ? ?Primary Care Physician:  Reynold Bowen, MD ?Primary Gastroenterologist: Lucio Edward, MD ? ?CHIEF COMPLAINT:  Personal history of colon polyps  ? ?HPI: Brendan Erickson is a 66 y.o. male with personal history of adenomatous colon polyps in 2017 for surveillance colonoscopy. ? ? ?Past Medical History:  ?Diagnosis Date  ? GERD (gastroesophageal reflux disease)   ? PRN OTC meds  ? Hyperlipidemia   ? on meds  ? Seasonal allergies   ? ? ?Past Surgical History:  ?Procedure Laterality Date  ? COLONOSCOPY WITH PROPOFOL N/A 09/12/2016  ? Procedure: COLONOSCOPY WITH PROPOFOL;  Surgeon: Garlan Fair, MD;  Location: [facility];  Service: Endoscopy;  Laterality: N/A;  ? NASAL SINUS SURGERY  1987  ? ? ?Prior to Admission medications   ?Medication Sig Start Date End Date Taking? Authorizing Provider  ?ezetimibe (ZETIA) 10 MG tablet Take 10 mg by mouth daily. 01/06/22  Yes [provider]  ?rosuvastatin (CRESTOR) 10 MG tablet Take 10 mg by mouth once a week. 08/11/16  Yes [provider]  ? ? ?Current Outpatient Medications  ?Medication Sig Dispense Refill  ? ezetimibe (ZETIA) 10 MG tablet Take 10 mg by mouth daily.    ? rosuvastatin (CRESTOR) 10 MG tablet Take 10 mg by mouth once a week.  5  ? ?Current Facility-Administered Medications  ?Medication Dose Route Frequency Provider Last Rate Last Admin  ? 0.9 %  sodium chloride infusion  500 mL Intravenous Once Ladene Artist, MD      ? ? ?Allergies as of 03/07/2022  ? (No Known Allergies)  ? ? ?Family History  ?Problem Relation Age of Onset  ? Prostate cancer Father   ? Diverticulitis Father   ? Colon polyps Sister 58  ? Diverticulitis Sister   ? Colon cancer Neg Hx   ? Esophageal cancer Neg Hx   ? Stomach cancer Neg Hx   ? Rectal cancer Neg Hx   ? ? ?Social History  ? ?Socioeconomic History  ? Marital status: Married  ?  Spouse name: Not on file  ? Number of children: Not on file  ? Years of education: Not on file  ? Highest  education level: Not on file  ?Occupational History  ? Not on file  ?Tobacco Use  ? Smoking status: Light Smoker  ?  Types: Cigars  ? Smokeless tobacco: Never  ?Vaping Use  ? Vaping Use: Never used  ?Substance and Sexual Activity  ? Alcohol use: Not Currently  ?  Alcohol/week: 0.0 - 4.0 standard drinks  ?  Comment: occassionally  ? Drug use: No  ? Sexual activity: Not on file  ?Other Topics Concern  ? Not on file  ?Social History Narrative  ? Not on file  ? ?Social Determinants of Health  ? ?Financial Resource Strain: Not on file  ?Food Insecurity: Not on file  ?Transportation Needs: Not on file  ?Physical Activity: Not on file  ?Stress: Not on file  ?Social Connections: Not on file  ?Intimate Partner Violence: Not on file  ? ? ?Review of Systems: ? ?All systems reviewed an negative except where noted in HPI. ? ?Gen: Denies any fever, chills, sweats, anorexia, fatigue, weakness, malaise, weight loss, and sleep disorder ?CV: Denies chest pain, angina, palpitations, syncope, orthopnea, PND, peripheral edema, and claudication. ?Resp: Denies dyspnea at rest, dyspnea with exercise, cough, sputum, wheezing, coughing up blood, and pleurisy. ?GI: Denies vomiting blood, jaundice, and fecal incontinence.   Denies dysphagia or odynophagia. ?  GU : Denies urinary burning, blood in urine, urinary frequency, urinary hesitancy, nocturnal urination, and urinary incontinence. ?MS: Denies joint pain, limitation of movement, and swelling, stiffness, low back pain, extremity pain. Denies muscle weakness, cramps, atrophy.  ?Derm: Denies rash, itching, dry skin, hives, moles, warts, or unhealing ulcers.  ?Psych: Denies depression, anxiety, memory loss, suicidal ideation, hallucinations, paranoia, and confusion. ?Heme: Denies bruising, bleeding, and enlarged lymph nodes. ?Neuro:  Denies any headaches, dizziness, paresthesias. ?Endo:  Denies any problems with DM, thyroid, adrenal function. ? ? ?Physical Exam: ?General:  Alert,  well-developed, in NAD ?Head:  Normocephalic and atraumatic. ?Eyes:  Sclera clear, no icterus.   Conjunctiva pink. ?Ears:  Normal auditory acuity. ?Mouth:  No deformity or lesions.  ?Neck:  Supple; no masses . ?Lungs:  Clear throughout to auscultation.   No wheezes, crackles, or rhonchi. No acute distress. ?Heart:  Regular rate and rhythm; no murmurs. ?Abdomen:  Soft, nondistended, nontender. No masses, hepatomegaly. No obvious masses.  Normal bowel .    ?Rectal:  Deferred   ?Msk:  Symmetrical without gross deformities.Marland Kitchen ?Pulses:  Normal pulses noted. ?Extremities:  Without edema. ?Neurologic:  Alert and  oriented x4;  grossly normal neurologically. ?Skin:  Intact without significant lesions or rashes. ?Cervical Nodes:  No significant cervical adenopathy. ?Psych:  Alert and cooperative. Normal mood and affect. ? ? ?Impression / Plan:  ? ?Personal history of adenomatous colon polyps in 2017 for surveillance colonoscopy. ? ?Marleena Shubert T. Fuller Plan  03/07/2022, 8:39 AM ?See Shea Evans, [facility], to contact our on call provider ? ? ?  ?

## 2022-03-07 NOTE — Patient Instructions (Signed)
Please read handouts provided. ?Continue present medications. ?Await pathology results. ?High Fiber Diet. ? ? ?YOU HAD AN ENDOSCOPIC PROCEDURE TODAY AT [facility]:   Refer to the procedure report that was given to you for any specific questions about what was found during the examination.  If the procedure report does not answer your questions, please call your gastroenterologist to clarify.  If you requested that your care partner not be given the details of your procedure findings, then the procedure report has been included in a sealed envelope for you to review at your convenience later. ? ?YOU SHOULD EXPECT: Some feelings of bloating in the abdomen. Passage of more gas than usual.  Walking can help get rid of the air that was put into your GI tract during the procedure and reduce the bloating. If you had a lower endoscopy (such as a colonoscopy or flexible sigmoidoscopy) you may notice spotting of blood in your stool or on the toilet paper. If you underwent a bowel prep for your procedure, you may not have a normal bowel movement for a few days. ? ?Please Note:  You might notice some irritation and congestion in your nose or some drainage.  This is from the oxygen used during your procedure.  There is no need for concern and it should clear up in a day or so. ? ?SYMPTOMS TO REPORT IMMEDIATELY: ? ?Following lower endoscopy (colonoscopy or flexible sigmoidoscopy): ? Excessive amounts of blood in the stool ? Significant tenderness or worsening of abdominal pains ? Swelling of the abdomen that is new, acute ? Fever of 100?F or higher ? ? ?For urgent or emergent issues, a gastroenterologist can be reached at any hour by calling [phone]. ?Do not use MyChart messaging for urgent concerns.  ? ? ?DIET:  We do recommend a small meal at first, but then you may proceed to your regular diet.  Drink plenty of fluids but you should avoid alcoholic beverages for 24 hours. ? ?ACTIVITY:  You should plan  to take it easy for the rest of today and you should NOT DRIVE or use heavy machinery until tomorrow (because of the sedation medicines used during the test).   ? ?FOLLOW UP: ?Our staff will call the number listed on your records 48-72 hours following your procedure to check on you and address any questions or concerns that you may have regarding the information given to you following your procedure. If we do not reach you, we will leave a message.  We will attempt to reach you two times.  During this call, we will ask if you have developed any symptoms of COVID 19. If you develop any symptoms (ie: fever, flu-like symptoms, shortness of breath, cough etc.) before then, please call [phone].  If you test positive for Covid 19 in the 2 weeks post procedure, please call and report this information to [place].   ? ?If any biopsies were taken you will be contacted by phone or by letter within the next 1-3 weeks.  Please call us at [phone] if you have not heard about the biopsies in 3 weeks.  ? ? ?SIGNATURES/CONFIDENTIALITY: ?You and/or your care partner have signed paperwork which will be entered into your electronic medical record.  These signatures attest to the fact that that the information above on your After Visit Summary has been reviewed and is understood.  Full responsibility of the confidentiality of this discharge information lies with you and/or your care-partner.  ?

## 2022-03-09 ENCOUNTER — Telehealth: Payer: Self-pay | Admitting: *Deleted

## 2022-03-09 NOTE — Telephone Encounter (Signed)
?  Follow up Call- ? ? ?  03/07/2022  ?  7:23 AM  ?Call back number  ?Post procedure Call Back phone  # [phone]  ?Permission to leave phone message Yes  ?  ? ?Patient questions: ? ?Do you have a fever, pain , or abdominal swelling? No. ?Pain Score  0 * ? ?Have you tolerated food without any problems? Yes.   ? ?Have you been able to return to your normal activities? Yes.   ? ?Do you have any questions about your discharge instructions: ?Diet   No. ?Medications  No. ?Follow up visit  No. ? ?Do you have questions or concerns about your Care? No. ? ?Actions: ?* If pain score is 4 or above: ?No action needed, pain <4. ? ? ?

## 2022-03-09 NOTE — Telephone Encounter (Signed)
Attempted f/u phone call. No answer. Left message. °

## 2022-03-14 ENCOUNTER — Encounter: Payer: Self-pay | Admitting: Gastroenterology

## 2022-04-03 DIAGNOSIS — M25551 Pain in right hip: Secondary | ICD-10-CM | POA: Diagnosis not present

## 2022-04-03 DIAGNOSIS — M545 Low back pain, unspecified: Secondary | ICD-10-CM | POA: Diagnosis not present

## 2022-04-04 DIAGNOSIS — M545 Low back pain, unspecified: Secondary | ICD-10-CM | POA: Diagnosis not present

## 2022-04-11 DIAGNOSIS — M545 Low back pain, unspecified: Secondary | ICD-10-CM | POA: Diagnosis not present

## 2022-04-17 DIAGNOSIS — M545 Low back pain, unspecified: Secondary | ICD-10-CM | POA: Diagnosis not present

## 2022-05-08 DIAGNOSIS — M545 Low back pain, unspecified: Secondary | ICD-10-CM | POA: Diagnosis not present

## 2022-05-08 DIAGNOSIS — M25551 Pain in right hip: Secondary | ICD-10-CM | POA: Diagnosis not present

## 2022-08-13 DIAGNOSIS — H6993 Unspecified Eustachian tube disorder, bilateral: Secondary | ICD-10-CM | POA: Diagnosis not present

## 2022-08-13 DIAGNOSIS — R001 Bradycardia, unspecified: Secondary | ICD-10-CM | POA: Diagnosis not present

## 2022-08-13 DIAGNOSIS — J019 Acute sinusitis, unspecified: Secondary | ICD-10-CM | POA: Diagnosis not present

## 2022-08-13 DIAGNOSIS — J029 Acute pharyngitis, unspecified: Secondary | ICD-10-CM | POA: Diagnosis not present

## 2022-08-15 DIAGNOSIS — R001 Bradycardia, unspecified: Secondary | ICD-10-CM | POA: Diagnosis not present

## 2022-09-12 DIAGNOSIS — H53143 Visual discomfort, bilateral: Secondary | ICD-10-CM | POA: Diagnosis not present

## 2022-09-13 DIAGNOSIS — E785 Hyperlipidemia, unspecified: Secondary | ICD-10-CM | POA: Diagnosis not present

## 2022-09-13 DIAGNOSIS — R7301 Impaired fasting glucose: Secondary | ICD-10-CM | POA: Diagnosis not present

## 2022-09-13 DIAGNOSIS — Z125 Encounter for screening for malignant neoplasm of prostate: Secondary | ICD-10-CM | POA: Diagnosis not present

## 2022-09-18 DIAGNOSIS — Z0000 Encounter for general adult medical examination without abnormal findings: Secondary | ICD-10-CM | POA: Diagnosis not present

## 2022-09-18 DIAGNOSIS — Z1331 Encounter for screening for depression: Secondary | ICD-10-CM | POA: Diagnosis not present

## 2022-09-18 DIAGNOSIS — R058 Other specified cough: Secondary | ICD-10-CM | POA: Diagnosis not present

## 2022-09-18 DIAGNOSIS — I700 Atherosclerosis of aorta: Secondary | ICD-10-CM | POA: Diagnosis not present

## 2022-09-18 DIAGNOSIS — Z23 Encounter for immunization: Secondary | ICD-10-CM | POA: Diagnosis not present

## 2022-09-18 DIAGNOSIS — Z1339 Encounter for screening examination for other mental health and behavioral disorders: Secondary | ICD-10-CM | POA: Diagnosis not present

## 2023-01-04 DIAGNOSIS — N401 Enlarged prostate with lower urinary tract symptoms: Secondary | ICD-10-CM | POA: Diagnosis not present

## 2023-01-04 DIAGNOSIS — R350 Frequency of micturition: Secondary | ICD-10-CM | POA: Diagnosis not present

## 2023-01-09 DIAGNOSIS — D2371 Other benign neoplasm of skin of right lower limb, including hip: Secondary | ICD-10-CM | POA: Diagnosis not present

## 2023-01-09 DIAGNOSIS — L570 Actinic keratosis: Secondary | ICD-10-CM | POA: Diagnosis not present

## 2023-01-09 DIAGNOSIS — L814 Other melanin hyperpigmentation: Secondary | ICD-10-CM | POA: Diagnosis not present

## 2023-01-09 DIAGNOSIS — L821 Other seborrheic keratosis: Secondary | ICD-10-CM | POA: Diagnosis not present

## 2023-02-12 DIAGNOSIS — H53143 Visual discomfort, bilateral: Secondary | ICD-10-CM | POA: Diagnosis not present

## 2023-09-17 DIAGNOSIS — R972 Elevated prostate specific antigen [PSA]: Secondary | ICD-10-CM | POA: Diagnosis not present

## 2023-09-17 DIAGNOSIS — E785 Hyperlipidemia, unspecified: Secondary | ICD-10-CM | POA: Diagnosis not present

## 2023-09-17 DIAGNOSIS — E039 Hypothyroidism, unspecified: Secondary | ICD-10-CM | POA: Diagnosis not present

## 2023-09-17 DIAGNOSIS — R7301 Impaired fasting glucose: Secondary | ICD-10-CM | POA: Diagnosis not present

## 2023-09-23 DIAGNOSIS — Z1212 Encounter for screening for malignant neoplasm of rectum: Secondary | ICD-10-CM | POA: Diagnosis not present

## 2023-09-24 DIAGNOSIS — I700 Atherosclerosis of aorta: Secondary | ICD-10-CM | POA: Diagnosis not present

## 2023-09-24 DIAGNOSIS — Z1331 Encounter for screening for depression: Secondary | ICD-10-CM | POA: Diagnosis not present

## 2023-09-24 DIAGNOSIS — R82998 Other abnormal findings in urine: Secondary | ICD-10-CM | POA: Diagnosis not present

## 2023-09-24 DIAGNOSIS — Z23 Encounter for immunization: Secondary | ICD-10-CM | POA: Diagnosis not present

## 2023-09-24 DIAGNOSIS — Z1339 Encounter for screening examination for other mental health and behavioral disorders: Secondary | ICD-10-CM | POA: Diagnosis not present

## 2023-09-24 DIAGNOSIS — Z0000 Encounter for general adult medical examination without abnormal findings: Secondary | ICD-10-CM | POA: Diagnosis not present

## 2023-11-14 DIAGNOSIS — M4800 Spinal stenosis, site unspecified: Secondary | ICD-10-CM | POA: Diagnosis not present

## 2023-11-14 DIAGNOSIS — M4722 Other spondylosis with radiculopathy, cervical region: Secondary | ICD-10-CM | POA: Diagnosis not present

## 2023-11-14 DIAGNOSIS — M4726 Other spondylosis with radiculopathy, lumbar region: Secondary | ICD-10-CM | POA: Diagnosis not present

## 2023-12-31 DIAGNOSIS — N401 Enlarged prostate with lower urinary tract symptoms: Secondary | ICD-10-CM | POA: Diagnosis not present

## 2023-12-31 DIAGNOSIS — R350 Frequency of micturition: Secondary | ICD-10-CM | POA: Diagnosis not present

## 2024-01-07 ENCOUNTER — Other Ambulatory Visit: Payer: Self-pay | Admitting: Urology

## 2024-01-07 DIAGNOSIS — R972 Elevated prostate specific antigen [PSA]: Secondary | ICD-10-CM

## 2024-01-07 DIAGNOSIS — N401 Enlarged prostate with lower urinary tract symptoms: Secondary | ICD-10-CM | POA: Diagnosis not present

## 2024-01-07 DIAGNOSIS — N138 Other obstructive and reflux uropathy: Secondary | ICD-10-CM

## 2024-01-07 DIAGNOSIS — R350 Frequency of micturition: Secondary | ICD-10-CM

## 2024-01-10 DIAGNOSIS — L814 Other melanin hyperpigmentation: Secondary | ICD-10-CM | POA: Diagnosis not present

## 2024-01-10 DIAGNOSIS — D2371 Other benign neoplasm of skin of right lower limb, including hip: Secondary | ICD-10-CM | POA: Diagnosis not present

## 2024-01-10 DIAGNOSIS — L570 Actinic keratosis: Secondary | ICD-10-CM | POA: Diagnosis not present

## 2024-01-10 DIAGNOSIS — L821 Other seborrheic keratosis: Secondary | ICD-10-CM | POA: Diagnosis not present

## 2024-02-11 ENCOUNTER — Encounter: Payer: Self-pay | Admitting: Urology

## 2024-02-19 DIAGNOSIS — H2513 Age-related nuclear cataract, bilateral: Secondary | ICD-10-CM | POA: Diagnosis not present

## 2024-02-20 ENCOUNTER — Ambulatory Visit
Admission: RE | Admit: 2024-02-20 | Discharge: 2024-02-20 | Disposition: A | Discharge status: Home / Self Care | Source: Ambulatory Visit | Attending: Urology | Admitting: Urology

## 2024-02-20 DIAGNOSIS — R972 Elevated prostate specific antigen [PSA]: Secondary | ICD-10-CM | POA: Diagnosis not present

## 2024-02-20 DIAGNOSIS — N401 Enlarged prostate with lower urinary tract symptoms: Secondary | ICD-10-CM

## 2024-02-20 DIAGNOSIS — K5730 Diverticulosis of large intestine without perforation or abscess without bleeding: Secondary | ICD-10-CM | POA: Diagnosis not present

## 2024-02-20 DIAGNOSIS — N400 Enlarged prostate without lower urinary tract symptoms: Secondary | ICD-10-CM | POA: Diagnosis not present

## 2024-02-20 DIAGNOSIS — R350 Frequency of micturition: Secondary | ICD-10-CM

## 2024-02-20 MED ORDER — GADOPICLENOL 0.5 MMOL/ML IV SOLN
8.0000 mL | Freq: Once | INTRAVENOUS | Status: AC | PRN
  Administered 2024-02-20: 8 mL via INTRAVENOUS

## 2024-11-18 ENCOUNTER — Ambulatory Visit: Admitting: Cardiology

## 2024-11-18 VITALS — BP 140/66 | HR 56 | Ht 71.0 in | Wt 174.0 lb

## 2024-11-18 DIAGNOSIS — Z0000 Encounter for general adult medical examination without abnormal findings: Secondary | ICD-10-CM | POA: Diagnosis not present

## 2024-11-18 DIAGNOSIS — R072 Precordial pain: Secondary | ICD-10-CM | POA: Insufficient documentation

## 2024-11-18 DIAGNOSIS — Z01812 Encounter for preprocedural laboratory examination: Secondary | ICD-10-CM | POA: Diagnosis not present

## 2024-11-18 DIAGNOSIS — R9389 Abnormal findings on diagnostic imaging of other specified body structures: Secondary | ICD-10-CM | POA: Diagnosis not present

## 2024-11-18 DIAGNOSIS — R0789 Other chest pain: Secondary | ICD-10-CM | POA: Diagnosis not present

## 2024-11-18 DIAGNOSIS — R931 Abnormal findings on diagnostic imaging of heart and coronary circulation: Secondary | ICD-10-CM | POA: Diagnosis not present

## 2024-11-18 DIAGNOSIS — Z8249 Family history of ischemic heart disease and other diseases of the circulatory system: Secondary | ICD-10-CM | POA: Diagnosis not present

## 2024-11-18 LAB — LIPID PANEL

## 2024-11-18 NOTE — Progress Notes (Signed)
 " Cardiology Office Note:  .   Date:  11/18/2024  ID:  Brendan Erickson, DOB Apr 22, 1956, MRN [id number] PCP: Brendan Senior, MD  [facility] Providers Cardiologist:  Oneil Parchment, MD     History of Present Illness: .   Brendan Erickson is a 69 y.o. male Discussed the use of AI scribe   History of Present Illness Brendan Erickson is a 69 year old male who presents with fatigue and a high coronary calcium score. He was referred by Dr. Marcey Brendan for evaluation of a high coronary calcium score of 3773.  Brendan Erickson, wife was a environmental consultant  He experiences significant fatigue, which began during his annual physical in late fall. Despite regular exercise, including biking, he frequently needs to take naps. This fatigue prompted further investigation by his primary care physician.  A coronary calcium score test revealed a score of 3773. He mentions occasional chest tightness, especially after eating, which he initially attributed to reflux. His current medications include Zetia 10 mg daily and Crestor 10 mg once a week, along with thyroid medication. Despite these medications, he continues to experience fatigue.  His past medical history includes thyroid issues. Recent lab work showed an LDL of 122 in 2020, A1c of 5.3, hemoglobin of 15.2, and creatinine of 1.0.  Family history is significant for cardiovascular issues, with both grandfathers having died of heart attacks, a grandmother who had a stroke, a father with vascular issues in his legs, and a mother who had a stroke in her nineties. He has two sisters who are reportedly in good health.  Socially, he is retired and engages in regular physical activity, such as biking. He does not smoke and maintains a healthy lifestyle. He retired on December 15th, shortly before the discovery of his high calcium score.      Studies Reviewed: SABRA   EKG Interpretation Date/Time:  Tuesday November 18 2024 10:22:13  EST Ventricular Rate:  52 PR Interval:  192 QRS Duration:  86 QT Interval:  404 QTC Calculation: 375 R Axis:   35  Text Interpretation: Sinus bradycardia Nonspecific ST and T wave abnormality No previous ECGs available Confirmed by Parchment Oneil ([id number]) on 11/18/2024 10:27:49 AM    Results Labs LDL (2020): 122 A1c: 5.3 Hemoglobin: 15.2 Creatinine: 1.0  Radiology Coronary artery calcium score (Fall 2025): Extensive coronary artery calcification; total score 3773, right coronary artery 2279, other vessels less than 1000 Risk Assessment/Calculations:           Physical Exam:   VS:  BP (!) 140/66 (BP Location: Right Arm, Patient Position: Sitting, Cuff Size: Normal)   Pulse (!) 56   Ht 5' 11 (1.803 m)   Wt 174 lb (78.9 kg)   SpO2 98%   BMI 24.27 kg/m    Wt Readings from Last 3 Encounters:  11/18/24 174 lb (78.9 kg)  03/07/22 167 lb (75.8 kg)  01/30/22 167 lb (75.8 kg)    GEN: Well nourished, well developed in no acute distress NECK: No JVD; No carotid bruits CARDIAC: RRR, no murmurs, no rubs, no gallops RESPIRATORY:  Clear to auscultation without rales, wheezing or rhonchi  ABDOMEN: Soft, non-tender, non-distended EXTREMITIES:  No edema; No deformity   ASSESSMENT AND PLAN: .    Assessment and Plan Assessment & Plan Coronary artery disease with markedly elevated coronary artery calcium score Coronary artery disease with a calcium score of 3773, indicating significant coronary artery calcification. Reports fatigue and occasional chest tightness,  especially postprandially, but no pain. Family history of cardiovascular disease. Current medications include Zetia and Crestor, which are protective against exacerbation of coronary artery disease. Discussed potential genetic component and importance of monitoring family members. Explained coronary CT scan to assess for stenosis or tightness in coronary arteries, noting potential challenges with heavy calcification. Discussed  potential need for further testing if CT scan is inconclusive. Emphasized importance of exercise, advising against extreme activities until further evaluation.  I am certainly okay with walking at this point.  Discussed potential use of Repatha if LDL is not at target of less than 55 or if lipoprotein A is elevated.   - Ordered coronary CT scan with IV contrast to assess for stenosis in coronary arteries. - Ordered blood tests including CMET, CBC, lipid panel, and lipoprotein A. - Continue Zetia 10 mg daily and Crestor 10 mg weekly. - Advised on moderate exercise, avoiding extreme activities until further evaluation. - Will consider Repatha if LDL is not at target or if lipoprotein A is elevated. - Continue low-dose aspirin, monitor for bleeding.  Hyperlipidemia Managed with Zetia and Crestor. Goal is to achieve LDL cholesterol less than 55 mg/dL to minimize risk of coronary events. Discussed potential addition of Repatha if LDL is not at target or if lipoprotein A is elevated. Emphasized importance of maintaining low LDL levels to stabilize plaque and prevent heart attacks. - Continue Zetia 10 mg daily and Crestor 10 mg weekly. - Ordered lipid panel to assess current LDL levels. - Will consider Repatha if LDL is not at target or if lipoprotein A is elevated.  Obviously he will let me know if symptoms worsen or become more worrisome.        Dispo: We will follow-up with studies, otherwise 1 year follow-up  Signed, Oneil Parchment, MD  "

## 2024-11-18 NOTE — Patient Instructions (Signed)
 Medication Instructions:  The current medical regimen is effective;  continue present plan and medications.  *If you need a refill on your cardiac medications before your next appointment, please call your pharmacy*  Lab Work: Please have blood work today (CBC, CMP,Lipid, LPa)  If you have labs (blood work) drawn today and your tests are completely normal, you will receive your results only by: MyChart Message (if you have MyChart) OR A paper copy in the mail If you have any lab test that is abnormal or we need to change your treatment, we will call you to review the results.  Testing/Procedures:   Your cardiac CT will be scheduled at:   [facility] [address]  [place], [place] [address]  Please enter the parking lot using the [address] entrance and use the FREE valet service at the patient drop-off area. Enter the building and check-in with registration on the main floor.  Please follow these instructions carefully (unless otherwise directed):  An IV will be required for this test and Nitroglycerin will be given.  Hold all erectile dysfunction medications at least 3 days (72 hrs) prior to test. (Ie viagra, cialis, sildenafil, tadalafil, etc)   On the Night Before the Test: Be sure to Drink plenty of water. Do not consume any caffeinated/decaffeinated beverages or chocolate 12 hours prior to your test. Do not take any antihistamines 12 hours prior to your test.  On the Day of the Test: Drink plenty of water until 1 hour prior to the test. Do not eat any food 1 hour prior to test. You may take your regular medications prior to the test.  Take metoprolol (Lopressor) two hours prior to test. If you take Furosemide/Hydrochlorothiazide/Spironolactone/Chlorthalidone, please HOLD on the morning of the test. Patients who wear a continuous glucose monitor MUST remove the device prior to scanning.  After the Test: Drink plenty of water. After  receiving IV contrast, you may experience a mild flushed feeling. This is normal. On occasion, you may experience a mild rash up to 24 hours after the test. This is not dangerous. If this occurs, you can take Benadryl 25 mg, Zyrtec, Claritin, or Allegra and increase your fluid intake. (Patients taking Tikosyn should avoid Benadryl, and may take Zyrtec, Claritin, or Allegra) If you experience trouble breathing, this can be serious. If it is severe call 911 IMMEDIATELY. If it is mild, please call our office.  We will call to schedule your test 2-4 weeks out understanding that some insurance companies will need an authorization prior to the service being performed.   For more information and frequently asked questions, please visit our website : [email or web address]  For non-scheduling related questions, please contact the cardiac imaging nurse navigator should you have any questions/concerns: Cardiac Imaging Nurse Navigators Direct Office Dial: [phone]   For scheduling needs, including cancellations and rescheduling, please call Brittany, [phone].   Follow-Up: At [facility], you and your health needs are our priority.  As part of our continuing mission to provide you with exceptional heart care, our providers are all part of one team.  This team includes your primary Cardiologist (physician) and Advanced Practice Providers or APPs (Physician Assistants and Nurse Practitioners) who all work together to provide you with the care you need, when you need it.  Your next appointment:   1 year(s)  Provider:   Oneil Parchment, MD    We recommend signing up for the patient portal called MyChart.  Sign up information  is provided on this After Visit Summary.  MyChart is used to connect with patients for Virtual Visits (Telemedicine).  Patients are able to view lab/test results, encounter notes, upcoming appointments, etc.  Non-urgent messages can be sent to your provider as  well.   To learn more about what you can do with MyChart, go to [email or web address].

## 2024-11-19 LAB — COMPREHENSIVE METABOLIC PANEL WITH GFR
ALT: 42 IU/L (ref 0–44)
AST: 32 IU/L (ref 0–40)
Albumin: 4.6 g/dL (ref 3.9–4.9)
Alkaline Phosphatase: 94 IU/L (ref 47–123)
BUN/Creatinine Ratio: 14 (ref 10–24)
BUN: 15 mg/dL (ref 8–27)
Bilirubin Total: 0.5 mg/dL (ref 0.0–1.2)
CO2: 25 mmol/L (ref 20–29)
Calcium: 9.8 mg/dL (ref 8.6–10.2)
Chloride: 103 mmol/L (ref 96–106)
Creatinine, Ser: 1.04 mg/dL (ref 0.76–1.27)
Globulin, Total: 1.7 g/dL (ref 1.5–4.5)
Glucose: 91 mg/dL (ref 70–99)
Potassium: 4.7 mmol/L (ref 3.5–5.2)
Sodium: 142 mmol/L (ref 134–144)
Total Protein: 6.3 g/dL (ref 6.0–8.5)
eGFR: 78 mL/min/1.73

## 2024-11-19 LAB — CBC
Hematocrit: 44.1 % (ref 37.5–51.0)
Hemoglobin: 15.1 g/dL (ref 13.0–17.7)
MCH: 30.8 pg (ref 26.6–33.0)
MCHC: 34.2 g/dL (ref 31.5–35.7)
MCV: 90 fL (ref 79–97)
Platelets: 190 x10E3/uL (ref 150–450)
RBC: 4.9 x10E6/uL (ref 4.14–5.80)
RDW: 12.3 % (ref 11.6–15.4)
WBC: 4.9 x10E3/uL (ref 3.4–10.8)

## 2024-11-19 LAB — LIPID PANEL
Cholesterol, Total: 165 mg/dL (ref 100–199)
HDL: 57 mg/dL
LDL CALC COMMENT:: 2.9 ratio (ref 0.0–5.0)
LDL Chol Calc (NIH): 82 mg/dL (ref 0–99)
Triglycerides: 152 mg/dL — AB (ref 0–149)
VLDL Cholesterol Cal: 26 mg/dL (ref 5–40)

## 2024-11-19 LAB — LIPOPROTEIN A (LPA): Lipoprotein (a): 11.7 nmol/L

## 2024-11-21 ENCOUNTER — Encounter (HOSPITAL_COMMUNITY): Payer: Self-pay

## 2024-11-22 ENCOUNTER — Ambulatory Visit: Payer: Self-pay | Admitting: Cardiology

## 2024-11-25 ENCOUNTER — Ambulatory Visit (HOSPITAL_COMMUNITY)

## 2024-11-25 ENCOUNTER — Ambulatory Visit (HOSPITAL_COMMUNITY)
Admission: RE | Admit: 2024-11-25 | Discharge: 2024-11-25 | Disposition: A | Source: Ambulatory Visit | Attending: Cardiology

## 2024-11-25 ENCOUNTER — Telehealth: Payer: Self-pay | Admitting: Cardiology

## 2024-11-25 ENCOUNTER — Ambulatory Visit (HOSPITAL_COMMUNITY)
Admission: RE | Admit: 2024-11-25 | Discharge: 2024-11-25 | Disposition: A | Discharge status: Home / Self Care | Source: Ambulatory Visit | Attending: Cardiology | Admitting: Cardiology

## 2024-11-25 ENCOUNTER — Other Ambulatory Visit: Payer: Self-pay | Admitting: Cardiology

## 2024-11-25 DIAGNOSIS — R931 Abnormal findings on diagnostic imaging of heart and coronary circulation: Secondary | ICD-10-CM | POA: Insufficient documentation

## 2024-11-25 DIAGNOSIS — I2510 Atherosclerotic heart disease of native coronary artery without angina pectoris: Secondary | ICD-10-CM

## 2024-11-25 DIAGNOSIS — R072 Precordial pain: Secondary | ICD-10-CM | POA: Insufficient documentation

## 2024-11-25 MED ORDER — IOHEXOL 350 MG/ML SOLN
100.0000 mL | Freq: Once | INTRAVENOUS | Status: AC | PRN
  Administered 2024-11-25: 100 mL via INTRAVENOUS

## 2024-11-25 MED ORDER — NITROGLYCERIN 0.4 MG SL SUBL
0.8000 mg | SUBLINGUAL_TABLET | Freq: Once | SUBLINGUAL | Status: AC
  Administered 2024-11-25: 0.8 mg via SUBLINGUAL

## 2024-11-25 MED ORDER — REPATHA SURECLICK 140 MG/ML ~~LOC~~ SOAJ: 140.0000 mg | SUBCUTANEOUS | 2 refills | Status: AC

## 2024-11-25 NOTE — Telephone Encounter (Signed)
 Spoke with pt who is aware to continue his current medication as listed and start Repatha  as well.  He reports the medication is on backorder per his pharmacy but they should be receiving it soon.  Pt reports medication is going to cost him approximately $500/month and is asking if there is any pt assistance programs that would help him get the cost lowered.  Aware I will forward this information to our pt assistance team for review.

## 2024-11-25 NOTE — Telephone Encounter (Signed)
 Pt c/o medication issue:  1. Name of Medication:   Evolocumab  (REPATHA  SURECLICK) 140 MG/ML SOAJ    2. How are you currently taking this medication (dosage and times per day)?    3. Are you having a reaction (difficulty breathing--STAT)? no  4. What is your medication issue? Medication is not cover under his insurance. Please advise    Also wanted to know if he is still suppose to be taking statin

## 2024-11-26 ENCOUNTER — Other Ambulatory Visit (HOSPITAL_COMMUNITY): Payer: Self-pay

## 2024-11-26 ENCOUNTER — Ambulatory Visit: Attending: Cardiology | Admitting: Cardiology

## 2024-11-26 ENCOUNTER — Telehealth: Payer: Self-pay | Admitting: Pharmacy Technician

## 2024-11-26 VITALS — BP 128/64 | HR 48 | Ht 71.0 in | Wt 172.0 lb

## 2024-11-26 DIAGNOSIS — R072 Precordial pain: Secondary | ICD-10-CM | POA: Diagnosis not present

## 2024-11-26 DIAGNOSIS — Z01812 Encounter for preprocedural laboratory examination: Secondary | ICD-10-CM | POA: Diagnosis not present

## 2024-11-26 DIAGNOSIS — R931 Abnormal findings on diagnostic imaging of heart and coronary circulation: Secondary | ICD-10-CM | POA: Diagnosis not present

## 2024-11-26 NOTE — Telephone Encounter (Signed)
 Please assess his Repatha  coverage

## 2024-11-26 NOTE — H&P (View-Only) (Signed)
 " Cardiology Office Note:  .   Date:  11/26/2024  ID:  Brendan Erickson, DOB [birth year], MRN [id number] PCP: Brendan Senior, MD  [facility] Providers Cardiologist:  Brendan Parchment, MD     History of Present Illness: .   Brendan Erickson is a 69 y.o. male Discussed the use of AI scribe  History of Present Illness Brendan Erickson is a 69 year old male with coronary artery disease who presents for discussion of cardiac catheterization. He is accompanied by his wife, Brendan Erickson.  Coronary artery disease and coronary ct findings - Coronary CT on November 25, 2024 revealed a coronary calcium score of 3694 (98th percentile). - Diffuse calcified plaque present in the left main coronary artery. - Moderate (50-69%) calcified plaque transitioning into severe (>70%) calcified plaque towards the Ostium of the LAD. - Severe (>70%) calcified plaque in the ostial LAD extending into the proximal LAD.  Anginal symptoms and functional status - Chest tightness occurs during activities such as walking in the woods. - Increased fatigue present. - Symptoms consistent with angina.  Lipid management and laboratory findings - Recently prescribed Repatha  due to severely elevated coronary calcium score. - Most recent LDL on rosuvastatin 10 mg daily was 82 (goal <70). - Continues Zetia 10 mg daily, aspirin, and Crestor 10 mg daily. - Lipoprotein(a) level was 12. - Creatinine was 1.04.  Cardiac rhythm and electrocardiogram findings - EKG from November 18, 2024 showed sinus bradycardia with a rate of 52 and nonspecific ST-T wave changes. - Resting bradycardia is normal for him.      ROS: No bleeding   Studies Reviewed: .        Results Labs LDL: 82 Lipoprotein(a): 12 Creatinine: 1.04 ALT: 42  Radiology Coronary CT (11/25/2024): Coronary calcium score 3694 (98th percentile); left main diffuse calcified plaque with moderate (50-69%) long calcified plaque terminating in severe (>70%) calcified  plaque toward ostium of LAD; ostial LAD with severe (>70%) calcified plaque extending into proximal LAD.  Diagnostic EKG (11/18/2024): Sinus bradycardia, rate 52, nonspecific ST-T wave changes. Risk Assessment/Calculations:            Physical Exam:   VS:  BP 128/64 (BP Location: Right Arm, Patient Position: Sitting)   Pulse (!) 48   Ht 5' 11 (1.803 m)   Wt 172 lb (78 kg)   SpO2 94%   BMI 23.99 kg/m    Wt Readings from Last 3 Encounters:  11/26/24 172 lb (78 kg)  11/18/24 174 lb (78.9 kg)  03/07/22 167 lb (75.8 kg)    GEN: Well nourished, well developed in no acute distress NECK: No JVD; No carotid bruits CARDIAC: Brendan Erickson, no murmurs, no rubs, no gallops RESPIRATORY:  Clear to auscultation without rales, wheezing or rhonchi  ABDOMEN: Soft, non-tender, non-distended EXTREMITIES:  No edema; No deformity   ASSESSMENT AND PLAN: .    Assessment and Plan Assessment & Plan Coronary artery disease with markedly elevated coronary artery calcium score and angina Coronary CT on January 27th, 2026, showed a coronary calcium score of 3694, at the 98th percentile. Imaging revealed diffuse calcified plaque in the left main artery, with moderate 50-69% calcified plaque transitioning to severe >70% calcified plaque towards the ostium of the LAD. The proximal LAD also showed severe >70% calcified plaque. Symptoms of angina include chest tightness and increased fatigue during physical activity. EKG from January 20th, 2026, showed sinus bradycardia with nonspecific ST-T wave changes, consistent with his resting bradycardia. - Proceed with cardiac  catheterization. - Continue aspirin, Crestor 10 mg, Repatha , and Zetia 10 mg daily. -Check ECHO as well  Hyperlipidemia LDL on rosuvastatin 10 mg daily was 82 mg/dL. The goal is to reduce LDL to less than 55 mg/dL. Lipoprotein(a) was 12, indicating low risk. - Continue current lipid-lowering therapy.       Informed Consent   Shared Decision  Making/Informed Consent The risks [stroke (1 in 1000), death (1 in 1000), kidney failure [usually temporary] (1 in 500), bleeding (1 in 200), allergic reaction [possibly serious] (1 in 200)], benefits (diagnostic support and management of coronary artery disease) and alternatives of a cardiac catheterization were discussed in detail with Brendan Erickson and he is willing to proceed.     Dispo: post cath  Signed, Brendan Parchment, MD   "

## 2024-11-26 NOTE — Patient Instructions (Signed)
 Medication Instructions:  The current medical regimen is effective;  continue present plan and medications.  *If you need a refill on your cardiac medications before your next appointment, please call your pharmacy*  Lab Work: Please have blood work today (CBC,BMP)  If you have labs (blood work) drawn today and your tests are completely normal, you will receive your results only by: MyChart Message (if you have MyChart) OR A paper copy in the mail If you have any lab test that is abnormal or we need to change your treatment, we will call you to review the results.  Testing/Procedures: Your physician has requested that you have an echocardiogram. Echocardiography is a painless test that uses sound waves to create images of your heart. It provides your doctor with information about the size and shape of your heart and how well your hearts chambers and valves are working. This procedure takes approximately one hour. There are no restrictions for this procedure. Please do NOT wear cologne, perfume, aftershave, or lotions (deodorant is allowed). Please arrive 15 minutes prior to your appointment time.  Please note: We ask at that you not bring children with you during ultrasound (echo/ vascular) testing. Due to room size and safety concerns, children are not allowed in the ultrasound rooms during exams. Our front office staff cannot provide observation of children in our lobby area while testing is being conducted. An adult accompanying a patient to their appointment will only be allowed in the ultrasound room at the discretion of the ultrasound technician under special circumstances. We apologize for any inconvenience.        Cardiac/Peripheral Catheterization   You are scheduled for a Cardiac Catheterization on Tuesday, February 3 with Dr. Gordy Bergamo.  1. Please arrive at the [facility] (Main Entrance A) at [facility]: [address] [place], [place] [address] at 5:30 AM (This  time is 2 hour(s) before your procedure to ensure your preparation).   Free valet parking service is available. You will check in at ADMITTING. The support person will be asked to wait in the waiting room.  It is OK to have someone drop you off and come back when you are ready to be discharged.        Special note: Every effort is made to have your procedure done on time. Please understand that emergencies sometimes delay scheduled procedures.  2. Diet: Nothing to eat after midnight.  3. Hydration:You need to be well hydrated before your procedure. On February 3, you may drink approved liquids (see below) until 2 hours before the procedure, with 16 oz of water as your last intake.   List of approved liquids water, clear juice, clear tea, black coffee, fruit juices, non-citric and without pulp, carbonated beverages, Gatorade, Kool -Aid, plain Jello-O and plain ice popsicles.  4. Labs: Today 5. Medication instructions in preparation for your procedure:  On the morning of your procedure, take Aspirin 81 mg and any morning medicines NOT listed above.  You may use sips of water.  6. Plan to go home the same day, you will only stay overnight if medically necessary. 7. You MUST have a responsible adult to drive you home. 8. An adult MUST be with you the first 24 hours after you arrive home. 9. Bring a current list of your medications, and the last time and date medication taken. 10. Bring ID and current insurance cards. 11.Please wear clothes that are easy to get on and off and wear slip-on shoes.  Thank you for allowing us  to care for you!   -- [facility]   Follow-Up: At [facility], you and your health needs are our priority.  As part of our continuing mission to provide you with exceptional heart care, our providers are all part of one team.  This team includes your primary Cardiologist (physician) and Advanced Practice Providers or APPs (Physician  Assistants and Nurse Practitioners) who all work together to provide you with the care you need, when you need it.  Your next appointment:   To be determined after your heart cath.  We recommend signing up for the patient portal called MyChart.  Sign up information is provided on this After Visit Summary.  MyChart is used to connect with patients for Virtual Visits (Telemedicine).  Patients are able to view lab/test results, encounter notes, upcoming appointments, etc.  Non-urgent messages can be sent to your provider as well.   To learn more about what you can do with MyChart, go to [email or web address].

## 2024-11-26 NOTE — Progress Notes (Signed)
 " Cardiology Office Note:  .   Date:  11/26/2024  ID:  Brendan Erickson, DOB [birth year], MRN [id number] PCP: Brendan Senior, MD  [facility] Providers Cardiologist:  Oneil Parchment, MD     History of Present Illness: .   Brendan Erickson is a 68 y.o. male Discussed the use of AI scribe  History of Present Illness Brendan Erickson is a 69 year old male with coronary artery disease who presents for discussion of cardiac catheterization. He is accompanied by his wife, Brendan Erickson.  Coronary artery disease and coronary ct findings - Coronary CT on November 25, 2024 revealed a coronary calcium score of 3694 (98th percentile). - Diffuse calcified plaque present in the left main coronary artery. - Moderate (50-69%) calcified plaque transitioning into severe (>70%) calcified plaque towards the Ostium of the LAD. - Severe (>70%) calcified plaque in the ostial LAD extending into the proximal LAD.  Anginal symptoms and functional status - Chest tightness occurs during activities such as walking in the woods. - Increased fatigue present. - Symptoms consistent with angina.  Lipid management and laboratory findings - Recently prescribed Repatha  due to severely elevated coronary calcium score. - Most recent LDL on rosuvastatin 10 mg daily was 82 (goal <70). - Continues Zetia 10 mg daily, aspirin, and Crestor 10 mg daily. - Lipoprotein(a) level was 12. - Creatinine was 1.04.  Cardiac rhythm and electrocardiogram findings - EKG from November 18, 2024 showed sinus bradycardia with a rate of 52 and nonspecific ST-T wave changes. - Resting bradycardia is normal for him.      ROS: No bleeding   Studies Reviewed: .        Results Labs LDL: 82 Lipoprotein(a): 12 Creatinine: 1.04 ALT: 42  Radiology Coronary CT (11/25/2024): Coronary calcium score 3694 (98th percentile); left main diffuse calcified plaque with moderate (50-69%) long calcified plaque terminating in severe (>70%) calcified  plaque toward ostium of LAD; ostial LAD with severe (>70%) calcified plaque extending into proximal LAD.  Diagnostic EKG (11/18/2024): Sinus bradycardia, rate 52, nonspecific ST-T wave changes. Risk Assessment/Calculations:            Physical Exam:   VS:  BP 128/64 (BP Location: Right Arm, Patient Position: Sitting)   Pulse (!) 48   Ht 5' 11 (1.803 m)   Wt 172 lb (78 kg)   SpO2 94%   BMI 23.99 kg/m    Wt Readings from Last 3 Encounters:  11/26/24 172 lb (78 kg)  11/18/24 174 lb (78.9 kg)  03/07/22 167 lb (75.8 kg)    GEN: Well nourished, well developed in no acute distress NECK: No JVD; No carotid bruits CARDIAC: [place] reg, no murmurs, no rubs, no gallops RESPIRATORY:  Clear to auscultation without rales, wheezing or rhonchi  ABDOMEN: Soft, non-tender, non-distended EXTREMITIES:  No edema; No deformity   ASSESSMENT AND PLAN: .    Assessment and Plan Assessment & Plan Coronary artery disease with markedly elevated coronary artery calcium score and angina Coronary CT on January 27th, 2026, showed a coronary calcium score of 3694, at the 98th percentile. Imaging revealed diffuse calcified plaque in the left main artery, with moderate 50-69% calcified plaque transitioning to severe >70% calcified plaque towards the ostium of the LAD. The proximal LAD also showed severe >70% calcified plaque. Symptoms of angina include chest tightness and increased fatigue during physical activity. EKG from January 20th, 2026, showed sinus bradycardia with nonspecific ST-T wave changes, consistent with his resting bradycardia. - Proceed with cardiac  catheterization. - Continue aspirin, Crestor 10 mg, Repatha , and Zetia 10 mg daily. -Check ECHO as well  Hyperlipidemia LDL on rosuvastatin 10 mg daily was 82 mg/dL. The goal is to reduce LDL to less than 55 mg/dL. Lipoprotein(a) was 12, indicating low risk. - Continue current lipid-lowering therapy.       Informed Consent   Shared Decision  Making/Informed Consent The risks [stroke (1 in 1000), death (1 in 1000), kidney failure [usually temporary] (1 in 500), bleeding (1 in 200), allergic reaction [possibly serious] (1 in 200)], benefits (diagnostic support and management of coronary artery disease) and alternatives of a cardiac catheterization were discussed in detail with Mr. Quant and he is willing to proceed.     Dispo: post cath  Signed, Oneil Parchment, MD   "

## 2024-11-26 NOTE — Telephone Encounter (Signed)
 Effective: 10/27/24 - 10/26/25   APW:[id number] ERW:[id number] Hmnle:[id number] PI:[id number]   Given to pharmacy and patient

## 2024-11-26 NOTE — Addendum Note (Signed)
 Addended by: JEFFRIE ONEIL BROCKS on: 11/26/2024 10:37 AM   Modules accepted: Orders

## 2024-11-26 NOTE — Telephone Encounter (Signed)
 Patient Advocate Encounter   The patient was approved for a [employer] grant that will help cover the cost of repatha  Total amount awarded, 2500.  Effective: 10/27/24 - 10/26/25   APW:[id number] ERW:[id number] Hmnle:[id number] PI:[id number] [employer] ID: [id number]   Pharmacy provided with approval and processing information. Patient informed via mychart

## 2024-11-27 ENCOUNTER — Ambulatory Visit (HOSPITAL_COMMUNITY)
Admission: RE | Admit: 2024-11-27 | Discharge: 2024-11-27 | Disposition: A | Discharge status: Home / Self Care | Source: Ambulatory Visit | Attending: Cardiology | Admitting: Cardiology

## 2024-11-27 ENCOUNTER — Ambulatory Visit: Payer: Self-pay | Admitting: Cardiology

## 2024-11-27 DIAGNOSIS — R931 Abnormal findings on diagnostic imaging of heart and coronary circulation: Secondary | ICD-10-CM | POA: Insufficient documentation

## 2024-11-27 DIAGNOSIS — R072 Precordial pain: Secondary | ICD-10-CM | POA: Diagnosis present

## 2024-11-27 LAB — BASIC METABOLIC PANEL WITH GFR
BUN/Creatinine Ratio: 12 (ref 10–24)
BUN: 13 mg/dL (ref 8–27)
CO2: 23 mmol/L (ref 20–29)
Calcium: 9.9 mg/dL (ref 8.6–10.2)
Chloride: 103 mmol/L (ref 96–106)
Creatinine, Ser: 1.07 mg/dL (ref 0.76–1.27)
Glucose: 101 mg/dL — ABNORMAL HIGH (ref 70–99)
Potassium: 4.9 mmol/L (ref 3.5–5.2)
Sodium: 143 mmol/L (ref 134–144)
eGFR: 75 mL/min/{1.73_m2}

## 2024-11-27 LAB — CBC
Hematocrit: 45.9 % (ref 37.5–51.0)
Hemoglobin: 15.6 g/dL (ref 13.0–17.7)
MCH: 30.1 pg (ref 26.6–33.0)
MCHC: 34 g/dL (ref 31.5–35.7)
MCV: 89 fL (ref 79–97)
Platelets: 188 10*3/uL (ref 150–450)
RBC: 5.18 x10E6/uL (ref 4.14–5.80)
RDW: 12.3 % (ref 11.6–15.4)
WBC: 5.1 10*3/uL (ref 3.4–10.8)

## 2024-11-27 LAB — ECHOCARDIOGRAM COMPLETE
Area-P 1/2: 2.99 cm2
S' Lateral: 3 cm

## 2024-12-01 ENCOUNTER — Telehealth: Payer: Self-pay | Admitting: *Deleted

## 2024-12-02 ENCOUNTER — Encounter (HOSPITAL_COMMUNITY): Admission: RE | Payer: Self-pay

## 2024-12-02 ENCOUNTER — Inpatient Hospital Stay (HOSPITAL_COMMUNITY): Admission: RE | Admit: 2024-12-02 | Source: Home / Self Care | Attending: Cardiology | Admitting: Cardiology

## 2024-12-02 ENCOUNTER — Encounter (HOSPITAL_COMMUNITY): Payer: Self-pay | Admitting: Cardiology

## 2024-12-02 ENCOUNTER — Other Ambulatory Visit: Payer: Self-pay

## 2024-12-02 ENCOUNTER — Inpatient Hospital Stay (HOSPITAL_COMMUNITY)

## 2024-12-02 DIAGNOSIS — Z01810 Encounter for preprocedural cardiovascular examination: Secondary | ICD-10-CM

## 2024-12-02 DIAGNOSIS — I200 Unstable angina: Principal | ICD-10-CM | POA: Diagnosis present

## 2024-12-02 DIAGNOSIS — R072 Precordial pain: Secondary | ICD-10-CM

## 2024-12-02 DIAGNOSIS — R931 Abnormal findings on diagnostic imaging of heart and coronary circulation: Secondary | ICD-10-CM

## 2024-12-02 DIAGNOSIS — Z01812 Encounter for preprocedural laboratory examination: Secondary | ICD-10-CM

## 2024-12-02 DIAGNOSIS — Z951 Presence of aortocoronary bypass graft: Principal | ICD-10-CM

## 2024-12-02 LAB — URINALYSIS, ROUTINE W REFLEX MICROSCOPIC
Bilirubin Urine: NEGATIVE
Glucose, UA: 50 mg/dL — AB
Hgb urine dipstick: NEGATIVE
Ketones, ur: NEGATIVE mg/dL
Leukocytes,Ua: NEGATIVE
Nitrite: NEGATIVE
Protein, ur: NEGATIVE mg/dL
Specific Gravity, Urine: 1.025 (ref 1.005–1.030)
pH: 6 (ref 5.0–8.0)

## 2024-12-02 LAB — TYPE AND SCREEN
ABO/RH(D): O POS
Antibody Screen: NEGATIVE
Unit division: 0
Unit division: 0

## 2024-12-02 LAB — HEMOGLOBIN A1C
Hgb A1c MFr Bld: 5.5 % (ref 4.8–5.6)
Mean Plasma Glucose: 111.15 mg/dL

## 2024-12-02 LAB — CBC WITH DIFFERENTIAL/PLATELET
Abs Immature Granulocytes: 0.03 10*3/uL (ref 0.00–0.07)
Basophils Absolute: 0.1 10*3/uL (ref 0.0–0.1)
Basophils Relative: 1 %
Eosinophils Absolute: 0 10*3/uL (ref 0.0–0.5)
Eosinophils Relative: 0 %
HCT: 43.8 % (ref 39.0–52.0)
Hemoglobin: 15.3 g/dL (ref 13.0–17.0)
Immature Granulocytes: 0 %
Lymphocytes Relative: 14 %
Lymphs Abs: 1.1 10*3/uL (ref 0.7–4.0)
MCH: 30.4 pg (ref 26.0–34.0)
MCHC: 34.9 g/dL (ref 30.0–36.0)
MCV: 86.9 fL (ref 80.0–100.0)
Monocytes Absolute: 0.6 10*3/uL (ref 0.1–1.0)
Monocytes Relative: 7 %
Neutro Abs: 6.2 10*3/uL (ref 1.7–7.7)
Neutrophils Relative %: 78 %
Platelets: 176 10*3/uL (ref 150–400)
RBC: 5.04 MIL/uL (ref 4.22–5.81)
RDW: 12.4 % (ref 11.5–15.5)
WBC: 7.9 10*3/uL (ref 4.0–10.5)
nRBC: 0 % (ref 0.0–0.2)

## 2024-12-02 LAB — BASIC METABOLIC PANEL WITH GFR
Anion gap: 11 (ref 5–15)
BUN: 11 mg/dL (ref 8–23)
CO2: 25 mmol/L (ref 22–32)
Calcium: 9.2 mg/dL (ref 8.9–10.3)
Chloride: 104 mmol/L (ref 98–111)
Creatinine, Ser: 1.04 mg/dL (ref 0.61–1.24)
GFR, Estimated: >60 mL/min
Glucose, Bld: 125 mg/dL — ABNORMAL HIGH (ref 70–99)
Potassium: 4.1 mmol/L (ref 3.5–5.1)
Sodium: 140 mmol/L (ref 135–145)

## 2024-12-02 LAB — PROTIME-INR
INR: 1 (ref 0.8–1.2)
Prothrombin Time: 13.6 s (ref 11.4–15.2)

## 2024-12-02 LAB — BPAM RBC
Blood Product Expiration Date: 202602232359
Blood Product Expiration Date: 202602232359
Unit Type and Rh: 5100
Unit Type and Rh: 5100

## 2024-12-02 LAB — ABO/RH: ABO/RH(D): O POS

## 2024-12-02 LAB — SURGICAL PCR SCREEN
MRSA, PCR: NEGATIVE
Staphylococcus aureus: NEGATIVE

## 2024-12-02 LAB — MRSA NEXT GEN BY PCR, NASAL: MRSA by PCR Next Gen: NOT DETECTED

## 2024-12-02 LAB — APTT: aPTT: 30 s (ref 24–36)

## 2024-12-02 LAB — PREPARE RBC (CROSSMATCH)

## 2024-12-02 LAB — POCT ACTIVATED CLOTTING TIME: Activated Clotting Time: 235 s

## 2024-12-02 MED ORDER — MUPIROCIN 2 % EX OINT
1.0000 | TOPICAL_OINTMENT | Freq: Two times a day (BID) | CUTANEOUS | Status: DC
  Administered 2024-12-02: 1 via NASAL
  Filled 2024-12-02: qty 22

## 2024-12-02 MED ORDER — HEPARIN (PORCINE) IN NACL 1000-0.9 UT/500ML-% IV SOLN
INTRAVENOUS | Status: DC | PRN
  Administered 2024-12-02: 1000 mL

## 2024-12-02 MED ORDER — IOHEXOL 350 MG/ML SOLN
INTRAVENOUS | Status: DC | PRN
  Administered 2024-12-02: 100 mL

## 2024-12-02 MED ORDER — LIDOCAINE HCL (PF) 1 % IJ SOLN
INTRAMUSCULAR | Status: AC
  Filled 2024-12-02: qty 30

## 2024-12-02 MED ORDER — HEPARIN SODIUM (PORCINE) 1000 UNIT/ML IJ SOLN
INTRAMUSCULAR | Status: AC
  Filled 2024-12-02: qty 10

## 2024-12-02 MED ORDER — NITROGLYCERIN IN D5W 200-5 MCG/ML-% IV SOLN
INTRAVENOUS | Status: AC | PRN
  Administered 2024-12-02: 10 ug/min via INTRAVENOUS

## 2024-12-02 MED ORDER — LEVOTHYROXINE SODIUM 75 MCG PO TABS: 75.0000 ug | ORAL_TABLET | Freq: Every day | ORAL | Status: DC

## 2024-12-02 MED ORDER — MILRINONE LACTATE IN DEXTROSE 20-5 MG/100ML-% IV SOLN
0.3000 ug/kg/min | INTRAVENOUS | Status: DC
  Filled 2024-12-02: qty 100

## 2024-12-02 MED ORDER — DEXMEDETOMIDINE HCL IN NACL 400 MCG/100ML IV SOLN
0.1000 ug/kg/h | INTRAVENOUS | Status: AC
  Administered 2024-12-03: .4 ug/kg/h via INTRAVENOUS
  Filled 2024-12-02: qty 100

## 2024-12-02 MED ORDER — ASPIRIN 81 MG PO CHEW: 81.0000 mg | CHEWABLE_TABLET | ORAL | Status: DC

## 2024-12-02 MED ORDER — NITROGLYCERIN IN D5W 200-5 MCG/ML-% IV SOLN
INTRAVENOUS | Status: AC
  Filled 2024-12-02: qty 250

## 2024-12-02 MED ORDER — EZETIMIBE 10 MG PO TABS: 10.0000 mg | ORAL_TABLET | Freq: Every day | ORAL | Status: DC

## 2024-12-02 MED ORDER — CHLORHEXIDINE GLUCONATE 0.12 % MT SOLN
15.0000 mL | Freq: Once | OROMUCOSAL | Status: AC
  Administered 2024-12-03: 15 mL via OROMUCOSAL
  Filled 2024-12-02: qty 15

## 2024-12-02 MED ORDER — VERAPAMIL HCL 2.5 MG/ML IV SOLN
INTRAVENOUS | Status: AC
  Filled 2024-12-02: qty 2

## 2024-12-02 MED ORDER — MANNITOL 20 % IV SOLN
INTRAVENOUS | Status: DC
  Filled 2024-12-02: qty 13

## 2024-12-02 MED ORDER — INSULIN REGULAR(HUMAN) IN NACL 100-0.9 UT/100ML-% IV SOLN
INTRAVENOUS | Status: AC
  Administered 2024-12-03: 1.4 [IU]/h via INTRAVENOUS
  Filled 2024-12-02: qty 100

## 2024-12-02 MED ORDER — NITROGLYCERIN 0.2 MG/ML ON CALL CATH LAB
INTRAVENOUS | Status: AC
  Filled 2024-12-02: qty 1

## 2024-12-02 MED ORDER — VANCOMYCIN HCL 1250 MG/250ML IV SOLN
1250.0000 mg | INTRAVENOUS | Status: AC
  Administered 2024-12-03: 1250 mg via INTRAVENOUS
  Filled 2024-12-02: qty 250

## 2024-12-02 MED ORDER — HEPARIN SODIUM (PORCINE) 1000 UNIT/ML IJ SOLN
INTRAMUSCULAR | Status: DC | PRN
  Administered 2024-12-02: 4000 [IU] via INTRAVENOUS
  Administered 2024-12-02: 5000 [IU] via INTRAVENOUS
  Administered 2024-12-02: 3000 [IU] via INTRAVENOUS
  Administered 2024-12-02: 1000 [IU] via INTRAVENOUS

## 2024-12-02 MED ORDER — NOREPINEPHRINE 4 MG/250ML-% IV SOLN
0.0000 ug/min | INTRAVENOUS | Status: DC
  Filled 2024-12-02: qty 250

## 2024-12-02 MED ORDER — ONDANSETRON HCL 4 MG/2ML IJ SOLN: 4.0000 mg | Freq: Four times a day (QID) | INTRAMUSCULAR | Status: DC | PRN

## 2024-12-02 MED ORDER — POTASSIUM CHLORIDE 2 MEQ/ML IV SOLN
80.0000 meq | INTRAVENOUS | Status: DC
  Filled 2024-12-02: qty 40

## 2024-12-02 MED ORDER — TRANEXAMIC ACID (OHS) PUMP PRIME SOLUTION
2.0000 mg/kg | INTRAVENOUS | Status: DC
  Filled 2024-12-02: qty 1.54

## 2024-12-02 MED ORDER — HEPARIN 30,000 UNITS/1000 ML (OHS) CELLSAVER SOLUTION
Status: DC
  Filled 2024-12-02: qty 1000

## 2024-12-02 MED ORDER — NITROGLYCERIN 1 MG/10 ML FOR IR/CATH LAB
INTRA_ARTERIAL | Status: DC | PRN
  Administered 2024-12-02: 200 ug via INTRACORONARY

## 2024-12-02 MED ORDER — ATROPINE SULFATE 1 MG/10ML IJ SOSY
PREFILLED_SYRINGE | INTRAMUSCULAR | Status: AC
  Filled 2024-12-02: qty 10

## 2024-12-02 MED ORDER — METOPROLOL TARTRATE 12.5 MG HALF TABLET: 12.5000 mg | ORAL_TABLET | Freq: Once | ORAL | Status: DC

## 2024-12-02 MED ORDER — VERAPAMIL HCL 2.5 MG/ML IV SOLN
INTRAVENOUS | Status: DC | PRN
  Administered 2024-12-02: 10 mL via INTRA_ARTERIAL

## 2024-12-02 MED ORDER — TRANEXAMIC ACID (OHS) BOLUS VIA INFUSION
15.0000 mg/kg | INTRAVENOUS | Status: AC
  Administered 2024-12-03: 1156.5 mg via INTRAVENOUS
  Filled 2024-12-02: qty 1157

## 2024-12-02 MED ORDER — FREE WATER
500.0000 mL | Freq: Once | Status: AC
  Administered 2024-12-02: 500 mL via ORAL

## 2024-12-02 MED ORDER — SODIUM CHLORIDE 0.9% FLUSH
3.0000 mL | Freq: Two times a day (BID) | INTRAVENOUS | Status: DC
  Administered 2024-12-02: 3 mL via INTRAVENOUS

## 2024-12-02 MED ORDER — CHLORHEXIDINE GLUCONATE CLOTH 2 % EX PADS
6.0000 | MEDICATED_PAD | Freq: Once | CUTANEOUS | Status: AC
  Administered 2024-12-02: 6 via TOPICAL

## 2024-12-02 MED ORDER — SODIUM CHLORIDE 0.9 % IV SOLN: 250.0000 mL | INTRAVENOUS | Status: DC | PRN

## 2024-12-02 MED ORDER — ROSUVASTATIN CALCIUM 20 MG PO TABS
20.0000 mg | ORAL_TABLET | Freq: Every day | ORAL | Status: DC
  Administered 2024-12-02: 20 mg via ORAL
  Filled 2024-12-02: qty 1

## 2024-12-02 MED ORDER — ACETAMINOPHEN 325 MG PO TABS: 650.0000 mg | ORAL_TABLET | ORAL | Status: DC | PRN

## 2024-12-02 MED ORDER — PHENYLEPHRINE HCL-NACL 20-0.9 MG/250ML-% IV SOLN
30.0000 ug/min | INTRAVENOUS | Status: AC
  Administered 2024-12-03: 15 ug/min via INTRAVENOUS
  Filled 2024-12-02: qty 250

## 2024-12-02 MED ORDER — FENTANYL CITRATE (PF) 100 MCG/2ML IJ SOLN
INTRAMUSCULAR | Status: DC | PRN
  Administered 2024-12-02: 25 ug via INTRAVENOUS

## 2024-12-02 MED ORDER — EPINEPHRINE HCL 5 MG/250ML IV SOLN IN NS
0.0000 ug/min | INTRAVENOUS | Status: DC
  Filled 2024-12-02: qty 250

## 2024-12-02 MED ORDER — ASPIRIN 81 MG PO TBEC: 81.0000 mg | DELAYED_RELEASE_TABLET | Freq: Every day | ORAL | Status: DC

## 2024-12-02 MED ORDER — FREE WATER: 500.0000 mL | Freq: Once | Status: DC

## 2024-12-02 MED ORDER — SODIUM CHLORIDE 0.9% FLUSH: 3.0000 mL | INTRAVENOUS | Status: DC | PRN

## 2024-12-02 MED ORDER — TRANEXAMIC ACID 1000 MG/10ML IV SOLN
1.5000 mg/kg/h | INTRAVENOUS | Status: AC
  Administered 2024-12-03: 1.5 mg/kg/h via INTRAVENOUS
  Filled 2024-12-02: qty 25

## 2024-12-02 MED ORDER — CEFAZOLIN SODIUM-DEXTROSE 2-4 GM/100ML-% IV SOLN
2.0000 g | INTRAVENOUS | Status: AC
  Administered 2024-12-03 (×2): 2 g via INTRAVENOUS
  Filled 2024-12-02: qty 100

## 2024-12-02 MED ORDER — LIDOCAINE HCL (PF) 1 % IJ SOLN
INTRAMUSCULAR | Status: DC | PRN
  Administered 2024-12-02: 2 mL

## 2024-12-02 MED ORDER — CEFAZOLIN SODIUM-DEXTROSE 2-4 GM/100ML-% IV SOLN
2.0000 g | INTRAVENOUS | Status: DC
  Filled 2024-12-02: qty 100

## 2024-12-02 MED ORDER — TEMAZEPAM 15 MG PO CAPS
15.0000 mg | ORAL_CAPSULE | Freq: Once | ORAL | Status: AC | PRN
  Administered 2024-12-02: 15 mg via ORAL
  Filled 2024-12-02: qty 1

## 2024-12-02 MED ORDER — BISACODYL 5 MG PO TBEC
5.0000 mg | DELAYED_RELEASE_TABLET | Freq: Once | ORAL | Status: AC
  Administered 2024-12-02: 5 mg via ORAL
  Filled 2024-12-02: qty 1

## 2024-12-02 MED ORDER — SODIUM CHLORIDE 0.9% FLUSH
3.0000 mL | Freq: Two times a day (BID) | INTRAVENOUS | Status: DC
  Administered 2024-12-02 (×2): 3 mL via INTRAVENOUS

## 2024-12-02 MED ORDER — ATROPINE SULFATE 1 MG/10ML IJ SOSY
PREFILLED_SYRINGE | INTRAMUSCULAR | Status: DC | PRN
  Administered 2024-12-02: .5 mg via INTRAVENOUS

## 2024-12-02 MED ORDER — CHLORHEXIDINE GLUCONATE CLOTH 2 % EX PADS
6.0000 | MEDICATED_PAD | Freq: Once | CUTANEOUS | Status: AC
  Administered 2024-12-03: 6 via TOPICAL

## 2024-12-02 MED ORDER — TAMSULOSIN HCL 0.4 MG PO CAPS
0.4000 mg | ORAL_CAPSULE | Freq: Every day | ORAL | Status: DC
  Administered 2024-12-02: 0.4 mg via ORAL
  Filled 2024-12-02: qty 1

## 2024-12-02 MED ORDER — FENTANYL CITRATE (PF) 100 MCG/2ML IJ SOLN
INTRAMUSCULAR | Status: AC
  Filled 2024-12-02: qty 2

## 2024-12-02 MED ORDER — MIDAZOLAM HCL 2 MG/2ML IJ SOLN
INTRAMUSCULAR | Status: AC
  Filled 2024-12-02: qty 2

## 2024-12-02 MED ORDER — NITROGLYCERIN IN D5W 200-5 MCG/ML-% IV SOLN
2.0000 ug/min | INTRAVENOUS | Status: AC
  Administered 2024-12-03: 10 ug/min via INTRAVENOUS
  Filled 2024-12-02: qty 250

## 2024-12-02 MED ORDER — PLASMA-LYTE A IV SOLN
INTRAVENOUS | Status: DC
  Filled 2024-12-02: qty 2.5

## 2024-12-02 MED ORDER — NITROGLYCERIN IN D5W 200-5 MCG/ML-% IV SOLN: 2.0000 ug/min | INTRAVENOUS | Status: DC

## 2024-12-02 MED ORDER — MIDAZOLAM HCL (PF) 2 MG/2ML IJ SOLN
INTRAMUSCULAR | Status: DC | PRN
  Administered 2024-12-02: 2 mg via INTRAVENOUS

## 2024-12-02 NOTE — Interval H&P Note (Signed)
 History and Physical Interval Note:  12/02/2024 7:40 AM  Brendan Erickson  has presented today for surgery, with the diagnosis of abnormal ct.  The various methods of treatment have been discussed with the patient and family. After consideration of risks, benefits and other options for treatment, the patient has consented to  Procedures: LEFT HEART CATH AND CORONARY ANGIOGRAPHY (N/A) and possible coronary angioplasty as a surgical intervention.  The patient's history has been reviewed, patient examined, no change in status, stable for surgery.  I have reviewed the patient's chart and labs.  Questions were answered to the patient's satisfaction.     Gordy Bergamo

## 2024-12-02 NOTE — Care Management (Signed)
 SDOH resources given

## 2024-12-02 NOTE — Progress Notes (Signed)
 TR BAND REMOVAL  LOCATION:  Right  radial  DEFLATED PER PROTOCOL:   Yes; reapplied due to re-bleed. @1200  @6ml  in band  TIME BAND OFF / DRESSING APPLIED: no. Re- start deflation protocol     SITE UPON REAPPLICATION OF TR band:    Level 0 with ecchymosis noted    CIRCULATION SENSATION AND MOVEMENT:    Within Normal Limits :Yes  COMMENTS:   no other comments

## 2024-12-02 NOTE — Procedures (Signed)
 Chaplain responded to nurse request to discuss parameters of a natural death, a HCPOA and Living Will.  Wife was at pt's side,  They wanted to make sure if something were to happen during the triple bypass tomorrow, she is his Health Agent.  There is not enough time to get a HCPOA notarized before pt goes to surgery early tomorrw morning.  [place] Chaplain

## 2024-12-02 NOTE — Discharge Instructions (Signed)
 "  [employer] -[facility]elessness Coordinated Entry Program. If you are experiencing homelessness in [place], [place] , your first point of contact should be Pensions Consultant. You can reach Coordinated Entry by calling ([phone]) [phone] or by emailing [email or web address] [email or web address].  Community access points: [employer] [phone] [address], [place]) every Tuesday from 9am-10am. [facility] ([address]. [address] [place]) every Wednesday from 8am-9am.   -The [employer] 867-856-2724) offers several services to local families, as funding allows. The Emergency Assistance Program (EAP), which they administer, provides household goods, free food, clothing, and financial aid to people in need in the [place]  area. The EAP program does have some qualification, and counselors will interview clients for financial assistance by written referral only. Referrals need to be made by the Department of Social Services or by other EAP approved human services agencies or charities in the area.  -[facility] of [employer], which can be reached at [phone], offers emergency assistance programs for those in need of help, such as food, rent assistance, a soup kitchen, shelter, and clothing. They are based in [facility] [place]  but provide a number of services to those that qualify for assistance.   [facility] Department of Social Services may be able to offer temporary financial assistance and cash grants for paying rent and utilities, Help may be provided for local county residents who may be experiencing personal crisis when other resources, including government programs, are not available. Call [phone]  -[facility] [employer] is a [employer] agency, The organization can offer emergency assistance for paying rent, caremark rx, utilities, food,  household products and furniture. They offer extensive emergency and transitional housing for families, children and single women, and also run a Boy's and [employer]. Thrift Shops, Secondary School Teacher, and other aid offered too. [address] [place], [place]  [address], [phone]  -[place] Low Income Energy Assistance Program -- This is offered for [facility] families. The federal government created [employer] Program provides a one-time cash grant payment to help eligible low-income families pay their electric and heating bills. [address] [place], [place]  [address], [phone]  -[place] Emergency Assistance -- A program offers emergency utility and rent funds for greater [place] area residents. The program can also provide counseling and referrals to charities and government programs. Also provides food and a free meal program that serves lunch Mondays - Saturdays and dinner seven days per week to individuals in the community. [address], [place], [place]  [address], [phone]  -[employer] - Offers affordable apartment and housing communities across      [place] and [place]. The low income and seniors can access public housing, rental assistance to qualified applicants, and apply for the section 8 rent subsidy program. Other programs include Chiropractor and Engineer, Maintenance. [address] [place], [place]  [address], dial [phone].  -The [facility] provides transitional housing to veterans and the disabled. Clients will also access other services too, including assistance in applying for Disability, life skills classes, case management, and assistance in finding permanent housing. [address] [place], [place]  [address], call [phone]  -[facility] Transitional Housing through [employer] is for  people who were just evicted or that are formerly homeless. The non-profit will also help then gain self-sufficiency, find a home or apartment  to live in, and also provides information on rent assistance when needed. Phone [phone]  -The [facility] helps low income, elderly, or disabled residents in seven counties in the [place] ([place], [place], [place], [place], [place], [place], [place], and [place]) save energy and reduce their utility bills by improving energy efficiency. Phone [phone].  -[employer] is located in the [place] [facility] in the [employer], [address], [place], [place] [address]. Parking is in the rear of the building. Phone: [phone]   General Email: info@gsohc .org  [facility] provides free housing counseling assistance in locating affordable rental housing or housing with support services for families and individuals in crisis and the chronically homeless. We provide potential resources for other housing needs like utilities. Our trained counselors also work with clients on budgeting and financial literacy in effort to empower them to take control of their financial situations. [employer] collaborates with homeless service providers and other stakeholders as part of the [employer] COC (Continuum of Care). The (COC) is a regional/local planning body that coordinates housing and services funding for homeless families and individuals. The role of [facility] in the COC is through housing counseling to work with people we serve on diversion strategies for those that are at imminent risk of becoming homeless. We also work with the Coordinated Assessment/Entry Specialist who attempts to find temporary solutions and/or connects the people to Housing First, Rapid Re-housing or transitional housing programs. Our Homelessness Prevention Housing Counselors meet  with clients on business days (Monday-Fridays, except scheduled holidays) from 8:30 am to 4:30 pm.  Legal assistance for evictions, foreclosure, and more -If you need free legal advice on civil issues, such as foreclosures, evictions, electronics engineer, government programs, domestic issues and more, Armed Forces Operational Officer Aid of [place]  [facility]) is a associate professor firm that provides free legal services and counsel to lower income people, seniors, disabled, and others, The goal is to ensure everyone has access to justice and fair representation. Call them at [phone].  [facility] for Housing and Community Studies can provide info about obtaining legal assistance with evictions. Phone ([phone]. Data Processing Manager  The [employer], [employer]. offers job and dispensing optician. Resources are focused on helping students obtain the skills and experiences that are necessary to compete in today's challenging and tight job market. The non-profit faith-based community action agency offers internship trainings as well as classroom instruction. Classes are tailored to meet the needs of people in the [facility] region. [place], [place] [address], [phone] Foreclosure Prevention/Debt Services Family Services of the [employer] Credit Counseling Service inludes debt and foreclosure prevention programs for local families. This includes money management, financial advice, budget review and development of a written action plan with a pensions consultant to help solve specific individual financial problems. In addition, housing and mortgage counselors can also provide pre- and post-purchase homeownership counseling, default resolution counseling (to prevent foreclosure) and reverse mortgage counseling. A Debt Management Program allows people and families with a high level of credit card or medical debt to consolidate and repay consumer debt and loans to  creditors and rebuild positive credit ratings and scores. Contact (336) D7650557. Community Clinics in [place] -Health Department [facility]: [address] [place], [place], [address]. [phone].  -Health Department [place] [facility]: [address], [place], [address]. [phone].  -[facility] offers medical care through a group of doctors, pharmacies  and other healthcare related agencies that offer services for low income, uninsured adults in [place]. Also offers adult Dental care and assistance with applying for an [employer]. Call [phone].   [facility]. This center provides low-cost health care to those without health insurance. Services offered include an onsite pharmacy. Phone [phone]. [address], [place].  -Medication Assistance Program serves as a link between pharmaceutical companies and patients to provide low cost or free prescription medications. This service is available for residents who meet certain income restrictions and have no insurance coverage. PLEASE CALL [phone]) [phone] [place]) OR ([phone] ([place])  -One Step Further: Materials Engineer, The [employer] Support & Nutrition Program, [place]. Call [phone]/ [phone].  Food Emergency Planning/management Officer -Urban [facility]-Food Bank: [address], [place] [address]. Phone [phone]  -[facility]: [address], [place], [place] [address]. [phone].  -[place] Food Finder: https://findfood.bargaincontractor.si  FLEEING VIOLENCE  -Family Services of the [place]- 24/7 Crisis line [phone]) -[facility] Family Justice Centers: (430) [phone]) 641-SAFE (260)[phone])  Transportation  -[facility] for an application. No fee for people over the age of 70. P: [phone]. Address: [address], [place], [place] [address]  -Senior Wheels Transportation-Age 88 and over. Limit of 1 ride per week for ambulatory participants. Limit 1 ride per month for non-ambulatory participants needing wheelchair transportation. Contact: (336) [phone] ([place]), [phone] [facility]).  -Access [place]: Available for people with disabilities who are unable to use fixed-route bus service. Fee applies. Contact: [phone] (For application requests)/(336) [phone] (Customer service)/(336) [phone] (I-ride reservation line)/(336) [phone] (Access [place] reservation line)   Building Services Engineer.org  Homeless Day Center  -[facility])   Day Center: M-F 8a-3p [address]  [place], [place] [address] [phone] Services include: laundry, barbering, support groups, case management, phone & computer access, showers, AA/NA mtgs, mental health/substance abuse nurse, job skills class, disability information, VA assistance, spiritual classes, etc.        HOMELESS SHELTERS [employer] Shelter at [employer]- Call [phone] ext. 347 or ext. 336. Located at [address], [place], [place] [address]  [facility] Mens Shelter- Call [phone]. Located at [address] [place] [address] [place] [address].  [facility]- [employer]. Call [phone]. Office located at [address], [place] [address].  [facility] Family Housing through [place] [phone].  [facility] Family [facility]- Call [phone]. Located at [address] [place], [place], [place] [address].  Room at the Inn-For Pregnant mothers. Call [phone]. Located at [address] [place], [address].  [facility]-For men in [place]. Call [phone].  Home of [employer] for [employer] [phone]. Office located at [address] [place], [address].  [employer] be agreeable to help with chores. Call [phone] ext. 5000.  Men's: [address] [place], [place] [address]. Women's: [facility]  [address] [place], [place] [address]   Crisis Services [facility] Mobile Crisis Management- [phone]  [facility] [address] [place], [place] [address]. Phone: [phone]   *[place] 2-1-1 is another useful way to locate resources in the community. Visit shedsizes.ch to find service information online. If you need additional assistance, 2-1-1 Referral Specialists are available 24 hours a day, every day by dialing 2-1-1 or  [phone] from any phone. The call is free, confidential, and available in any language. "

## 2024-12-02 NOTE — Plan of Care (Signed)
" °  Problem: Activity: Goal: Ability to return to baseline activity level will improve Outcome: Progressing   Problem: Cardiovascular: Goal: Ability to achieve and maintain adequate cardiovascular perfusion will improve Outcome: Progressing Goal: Vascular access site(s) Level 0-1 will be maintained Outcome: Progressing   Problem: Education: Goal: Knowledge of General Education information will improve Description: Including pain rating scale, medication(s)/side effects and non-pharmacologic comfort measures Outcome: Progressing   Problem: Health Behavior/Discharge Planning: Goal: Ability to manage health-related needs will improve Outcome: Progressing   Problem: Clinical Measurements: Goal: Ability to maintain clinical measurements within normal limits will improve Outcome: Progressing Goal: Respiratory complications will improve 12/02/2024 1535 by Blinda Ores D, RN Outcome: Progressing 12/02/2024 1354 by Blinda Ores BIRCH, RN Outcome: Progressing Goal: Cardiovascular complication will be avoided Outcome: Progressing   Problem: Nutrition: Goal: Adequate nutrition will be maintained Outcome: Progressing   Problem: Elimination: Goal: Will not experience complications related to bowel motility 12/02/2024 1535 by Blinda Ores D, RN Outcome: Progressing 12/02/2024 1354 by Blinda Ores BIRCH, RN Outcome: Progressing Goal: Will not experience complications related to urinary retention 12/02/2024 1535 by Blinda Ores BIRCH, RN Outcome: Progressing 12/02/2024 1354 by Blinda Ores D, RN Outcome: Progressing   Problem: Pain Managment: Goal: General experience of comfort will improve and/or be controlled 12/02/2024 1535 by Blinda Ores D, RN Outcome: Progressing 12/02/2024 1354 by Blinda Ores BIRCH, RN Outcome: Progressing   "

## 2024-12-02 NOTE — Progress Notes (Signed)
" ° ° °  PROCEDURAL EXPEDITER PROGRESS NOTE  Patient Name: Brendan Erickson  DOB:[birth year] Date of Admission: 12/02/2024  Date of Assessment:12/02/24   -------------------------------------------------------------------------------------------------------------------   Brief clinical summary: pt is a 69 year old male scheduled for surgery on 12/03/2024 for a CABG  Orders in place:  Yes   Communication with surgical team if no orders: n/a  Labs, test, and orders reviewed: yes  Requires surgical clearance:  No  What type of clearance: n/a  Clearance received: n/a  Barriers noted:n/a   Intervention provided by [facility] team: n/a  Barrier resolved:  not applicable   -------------------------------------------------------------------------------------------------------------------  [facility] Patient Care Command Expediter, Ronal DELENA Bald Please contact us  directly via secure chat (search for [facility]) or by calling us  at [phone] [facility]).  "

## 2024-12-02 NOTE — Plan of Care (Signed)
" °  Problem: Activity: Goal: Ability to return to baseline activity level will improve Outcome: Progressing   Problem: Cardiovascular: Goal: Ability to achieve and maintain adequate cardiovascular perfusion will improve Outcome: Progressing Goal: Vascular access site(s) Level 0-1 will be maintained Outcome: Progressing   Problem: Health Behavior/Discharge Planning: Goal: Ability to manage health-related needs will improve Outcome: Progressing   Problem: Clinical Measurements: Goal: Ability to maintain clinical measurements within normal limits will improve Outcome: Progressing Goal: Respiratory complications will improve Outcome: Progressing Goal: Cardiovascular complication will be avoided Outcome: Progressing   Problem: Elimination: Goal: Will not experience complications related to bowel motility Outcome: Progressing Goal: Will not experience complications related to urinary retention Outcome: Progressing   Problem: Pain Managment: Goal: General experience of comfort will improve and/or be controlled Outcome: Progressing   "

## 2024-12-03 ENCOUNTER — Inpatient Hospital Stay (HOSPITAL_COMMUNITY)

## 2024-12-03 ENCOUNTER — Other Ambulatory Visit: Payer: Self-pay

## 2024-12-03 ENCOUNTER — Encounter (HOSPITAL_COMMUNITY): Payer: Self-pay | Admitting: Cardiology

## 2024-12-03 ENCOUNTER — Encounter (HOSPITAL_COMMUNITY): Admission: RE | Payer: Self-pay | Source: Home / Self Care | Attending: Cardiology

## 2024-12-03 DIAGNOSIS — E785 Hyperlipidemia, unspecified: Secondary | ICD-10-CM

## 2024-12-03 DIAGNOSIS — K219 Gastro-esophageal reflux disease without esophagitis: Secondary | ICD-10-CM | POA: Diagnosis not present

## 2024-12-03 DIAGNOSIS — I200 Unstable angina: Secondary | ICD-10-CM

## 2024-12-03 DIAGNOSIS — E039 Hypothyroidism, unspecified: Secondary | ICD-10-CM | POA: Diagnosis not present

## 2024-12-03 DIAGNOSIS — Z951 Presence of aortocoronary bypass graft: Secondary | ICD-10-CM | POA: Diagnosis not present

## 2024-12-03 DIAGNOSIS — R931 Abnormal findings on diagnostic imaging of heart and coronary circulation: Secondary | ICD-10-CM

## 2024-12-03 DIAGNOSIS — D696 Thrombocytopenia, unspecified: Secondary | ICD-10-CM

## 2024-12-03 DIAGNOSIS — I2510 Atherosclerotic heart disease of native coronary artery without angina pectoris: Secondary | ICD-10-CM | POA: Diagnosis not present

## 2024-12-03 LAB — POCT I-STAT, CHEM 8
BUN: 8 mg/dL (ref 8–23)
BUN: 8 mg/dL (ref 8–23)
BUN: 6 mg/dL — ABNORMAL LOW (ref 8–23)
Calcium, Ion: 1.21 mmol/L (ref 1.15–1.40)
Calcium, Ion: 1.06 mmol/L — ABNORMAL LOW (ref 1.15–1.40)
Calcium, Ion: 1.28 mmol/L (ref 1.15–1.40)
Chloride: 102 mmol/L (ref 98–111)
Chloride: 103 mmol/L (ref 98–111)
Chloride: 104 mmol/L (ref 98–111)
Creatinine, Ser: 0.9 mg/dL (ref 0.61–1.24)
Creatinine, Ser: 0.8 mg/dL (ref 0.61–1.24)
Creatinine, Ser: 0.8 mg/dL (ref 0.61–1.24)
Glucose, Bld: 134 mg/dL — ABNORMAL HIGH (ref 70–99)
Glucose, Bld: 145 mg/dL — ABNORMAL HIGH (ref 70–99)
Glucose, Bld: 132 mg/dL — ABNORMAL HIGH (ref 70–99)
HCT: 36 % — ABNORMAL LOW (ref 39.0–52.0)
HCT: 28 % — ABNORMAL LOW (ref 39.0–52.0)
HCT: 30 % — ABNORMAL LOW (ref 39.0–52.0)
Hemoglobin: 12.2 g/dL — ABNORMAL LOW (ref 13.0–17.0)
Hemoglobin: 9.5 g/dL — ABNORMAL LOW (ref 13.0–17.0)
Hemoglobin: 10.2 g/dL — ABNORMAL LOW (ref 13.0–17.0)
Potassium: 3.7 mmol/L (ref 3.5–5.1)
Potassium: 4.6 mmol/L (ref 3.5–5.1)
Potassium: 4.3 mmol/L (ref 3.5–5.1)
Sodium: 139 mmol/L (ref 135–145)
Sodium: 138 mmol/L (ref 135–145)
Sodium: 138 mmol/L (ref 135–145)
TCO2: 25 mmol/L (ref 22–32)
TCO2: 26 mmol/L (ref 22–32)
TCO2: 23 mmol/L (ref 22–32)

## 2024-12-03 LAB — POCT I-STAT EG7
Acid-Base Excess: 0 mmol/L (ref 0.0–2.0)
Bicarbonate: 25 mmol/L (ref 20.0–28.0)
Calcium, Ion: 1.08 mmol/L — ABNORMAL LOW (ref 1.15–1.40)
HCT: 28 % — ABNORMAL LOW (ref 39.0–52.0)
Hemoglobin: 9.5 g/dL — ABNORMAL LOW (ref 13.0–17.0)
O2 Saturation: 82 %
Potassium: 4.5 mmol/L (ref 3.5–5.1)
Sodium: 139 mmol/L (ref 135–145)
TCO2: 26 mmol/L (ref 22–32)
pCO2, Ven: 43.6 mmHg — ABNORMAL LOW (ref 44–60)
pH, Ven: 7.367 (ref 7.25–7.43)
pO2, Ven: 48 mmHg — ABNORMAL HIGH (ref 32–45)

## 2024-12-03 LAB — ECHO INTRAOPERATIVE TEE
AV Mean grad: 4 mmHg
AV Peak grad: 7.4 mmHg
Ao pk vel: 1.36 m/s
Area-P 1/2: 2.46 cm2
Height: 71 in
Weight: 2648 [oz_av]

## 2024-12-03 LAB — POCT I-STAT 7, (LYTES, BLD GAS, ICA,H+H)
Acid-Base Excess: 0 mmol/L (ref 0.0–2.0)
Acid-Base Excess: 0 mmol/L (ref 0.0–2.0)
Acid-base deficit: 2 mmol/L (ref 0.0–2.0)
Acid-base deficit: 2 mmol/L (ref 0.0–2.0)
Bicarbonate: 24.6 mmol/L (ref 20.0–28.0)
Bicarbonate: 24.9 mmol/L (ref 20.0–28.0)
Bicarbonate: 21.6 mmol/L (ref 20.0–28.0)
Bicarbonate: 21.8 mmol/L (ref 20.0–28.0)
Calcium, Ion: 1.03 mmol/L — ABNORMAL LOW (ref 1.15–1.40)
Calcium, Ion: 1.06 mmol/L — ABNORMAL LOW (ref 1.15–1.40)
Calcium, Ion: 1.28 mmol/L (ref 1.15–1.40)
Calcium, Ion: 1.22 mmol/L (ref 1.15–1.40)
HCT: 28 % — ABNORMAL LOW (ref 39.0–52.0)
HCT: 27 % — ABNORMAL LOW (ref 39.0–52.0)
HCT: 29 % — ABNORMAL LOW (ref 39.0–52.0)
HCT: 32 % — ABNORMAL LOW (ref 39.0–52.0)
Hemoglobin: 9.5 g/dL — ABNORMAL LOW (ref 13.0–17.0)
Hemoglobin: 9.2 g/dL — ABNORMAL LOW (ref 13.0–17.0)
Hemoglobin: 9.9 g/dL — ABNORMAL LOW (ref 13.0–17.0)
Hemoglobin: 10.9 g/dL — ABNORMAL LOW (ref 13.0–17.0)
O2 Saturation: 100 %
O2 Saturation: 100 %
O2 Saturation: 100 %
O2 Saturation: 96 %
Patient temperature: 35.4
Potassium: 4.1 mmol/L (ref 3.5–5.1)
Potassium: 4.7 mmol/L (ref 3.5–5.1)
Potassium: 4.3 mmol/L (ref 3.5–5.1)
Potassium: 3.9 mmol/L (ref 3.5–5.1)
Sodium: 140 mmol/L (ref 135–145)
Sodium: 138 mmol/L (ref 135–145)
Sodium: 137 mmol/L (ref 135–145)
Sodium: 140 mmol/L (ref 135–145)
TCO2: 26 mmol/L (ref 22–32)
TCO2: 26 mmol/L (ref 22–32)
TCO2: 23 mmol/L (ref 22–32)
TCO2: 23 mmol/L (ref 22–32)
pCO2 arterial: 40.2 mmHg (ref 32–48)
pCO2 arterial: 40.2 mmHg (ref 32–48)
pCO2 arterial: 30.7 mmHg — ABNORMAL LOW (ref 32–48)
pCO2 arterial: 30.9 mmHg — ABNORMAL LOW (ref 32–48)
pH, Arterial: 7.394 (ref 7.35–7.45)
pH, Arterial: 7.401 (ref 7.35–7.45)
pH, Arterial: 7.456 — ABNORMAL HIGH (ref 7.35–7.45)
pH, Arterial: 7.45 (ref 7.35–7.45)
pO2, Arterial: 345 mmHg — ABNORMAL HIGH (ref 83–108)
pO2, Arterial: 302 mmHg — ABNORMAL HIGH (ref 83–108)
pO2, Arterial: 361 mmHg — ABNORMAL HIGH (ref 83–108)
pO2, Arterial: 73 mmHg — ABNORMAL LOW (ref 83–108)

## 2024-12-03 LAB — HEMOGLOBIN AND HEMATOCRIT, BLOOD
HCT: 28.3 % — ABNORMAL LOW (ref 39.0–52.0)
Hemoglobin: 9.5 g/dL — ABNORMAL LOW (ref 13.0–17.0)

## 2024-12-03 LAB — BASIC METABOLIC PANEL WITH GFR
Anion gap: 10 (ref 5–15)
BUN: 12 mg/dL (ref 8–23)
CO2: 26 mmol/L (ref 22–32)
Calcium: 8.9 mg/dL (ref 8.9–10.3)
Chloride: 103 mmol/L (ref 98–111)
Creatinine, Ser: 1 mg/dL (ref 0.61–1.24)
GFR, Estimated: >60 mL/min
Glucose, Bld: 110 mg/dL — ABNORMAL HIGH (ref 70–99)
Potassium: 3.8 mmol/L (ref 3.5–5.1)
Sodium: 139 mmol/L (ref 135–145)

## 2024-12-03 LAB — CBC
HCT: 42.8 % (ref 39.0–52.0)
HCT: 33.2 % — ABNORMAL LOW (ref 39.0–52.0)
HCT: 34.1 % — ABNORMAL LOW (ref 39.0–52.0)
Hemoglobin: 14.3 g/dL (ref 13.0–17.0)
Hemoglobin: 11.3 g/dL — ABNORMAL LOW (ref 13.0–17.0)
Hemoglobin: 11.5 g/dL — ABNORMAL LOW (ref 13.0–17.0)
MCH: 29.5 pg (ref 26.0–34.0)
MCH: 30.2 pg (ref 26.0–34.0)
MCH: 30.6 pg (ref 26.0–34.0)
MCHC: 33.4 g/dL (ref 30.0–36.0)
MCHC: 34 g/dL (ref 30.0–36.0)
MCHC: 33.7 g/dL (ref 30.0–36.0)
MCV: 88.4 fL (ref 80.0–100.0)
MCV: 88.8 fL (ref 80.0–100.0)
MCV: 90.7 fL (ref 80.0–100.0)
Platelets: 161 10*3/uL (ref 150–400)
Platelets: 107 10*3/uL — ABNORMAL LOW (ref 150–400)
Platelets: 119 10*3/uL — ABNORMAL LOW (ref 150–400)
RBC: 4.84 MIL/uL (ref 4.22–5.81)
RBC: 3.74 MIL/uL — ABNORMAL LOW (ref 4.22–5.81)
RBC: 3.76 MIL/uL — ABNORMAL LOW (ref 4.22–5.81)
RDW: 12.5 % (ref 11.5–15.5)
RDW: 12.4 % (ref 11.5–15.5)
RDW: 12.5 % (ref 11.5–15.5)
WBC: 7.4 10*3/uL (ref 4.0–10.5)
WBC: 11.8 10*3/uL — ABNORMAL HIGH (ref 4.0–10.5)
WBC: 9.8 10*3/uL (ref 4.0–10.5)
nRBC: 0 % (ref 0.0–0.2)
nRBC: 0 % (ref 0.0–0.2)
nRBC: 0 % (ref 0.0–0.2)

## 2024-12-03 LAB — FIBRINOGEN: Fibrinogen: 201 mg/dL — ABNORMAL LOW (ref 210–475)

## 2024-12-03 LAB — GLUCOSE, CAPILLARY
Glucose-Capillary: 109 mg/dL — ABNORMAL HIGH (ref 70–99)
Glucose-Capillary: 112 mg/dL — ABNORMAL HIGH (ref 70–99)
Glucose-Capillary: 117 mg/dL — ABNORMAL HIGH (ref 70–99)
Glucose-Capillary: 121 mg/dL — ABNORMAL HIGH (ref 70–99)
Glucose-Capillary: 121 mg/dL — ABNORMAL HIGH (ref 70–99)
Glucose-Capillary: 129 mg/dL — ABNORMAL HIGH (ref 70–99)
Glucose-Capillary: 141 mg/dL — ABNORMAL HIGH (ref 70–99)
Glucose-Capillary: 145 mg/dL — ABNORMAL HIGH (ref 70–99)
Glucose-Capillary: 146 mg/dL — ABNORMAL HIGH (ref 70–99)

## 2024-12-03 LAB — APTT: aPTT: 28 s (ref 24–36)

## 2024-12-03 LAB — PROTIME-INR
INR: 1.3 — ABNORMAL HIGH (ref 0.8–1.2)
Prothrombin Time: 16.6 s — ABNORMAL HIGH (ref 11.4–15.2)

## 2024-12-03 LAB — PLATELET COUNT: Platelets: 107 10*3/uL — ABNORMAL LOW (ref 150–400)

## 2024-12-03 LAB — MAGNESIUM: Magnesium: 2.9 mg/dL — ABNORMAL HIGH (ref 1.7–2.4)

## 2024-12-03 MED ORDER — FENTANYL CITRATE (PF) 250 MCG/5ML IJ SOLN
INTRAMUSCULAR | Status: AC
  Filled 2024-12-03: qty 5

## 2024-12-03 MED ORDER — MIDAZOLAM HCL (PF) 10 MG/2ML IJ SOLN
INTRAMUSCULAR | Status: AC
  Filled 2024-12-03: qty 2

## 2024-12-03 MED ORDER — VANCOMYCIN HCL IN DEXTROSE 1-5 GM/200ML-% IV SOLN
1000.0000 mg | Freq: Once | INTRAVENOUS | Status: AC
  Administered 2024-12-03: 1000 mg via INTRAVENOUS
  Filled 2024-12-03: qty 200

## 2024-12-03 MED ORDER — NITROGLYCERIN IN D5W 200-5 MCG/ML-% IV SOLN: 0.0000 ug/min | INTRAVENOUS | Status: DC

## 2024-12-03 MED ORDER — MAGNESIUM SULFATE 4 GM/100ML IV SOLN
4.0000 g | Freq: Once | INTRAVENOUS | Status: AC
  Administered 2024-12-03: 4 g via INTRAVENOUS
  Filled 2024-12-03: qty 100

## 2024-12-03 MED ORDER — ASPIRIN 81 MG PO CHEW: 324.0000 mg | CHEWABLE_TABLET | Freq: Every day | ORAL | Status: AC

## 2024-12-03 MED ORDER — PROPOFOL 500 MG/50ML IV EMUL
INTRAVENOUS | Status: DC | PRN
  Administered 2024-12-03: 35 ug/kg/min via INTRAVENOUS

## 2024-12-03 MED ORDER — PLASMA-LYTE A IV SOLN: INTRAVENOUS | Status: DC | PRN

## 2024-12-03 MED ORDER — HEPARIN SODIUM (PORCINE) 1000 UNIT/ML IJ SOLN
INTRAMUSCULAR | Status: AC
  Filled 2024-12-03: qty 10

## 2024-12-03 MED ORDER — CHLORHEXIDINE GLUCONATE 0.12 % MT SOLN
15.0000 mL | OROMUCOSAL | Status: AC
  Administered 2024-12-03: 15 mL via OROMUCOSAL
  Filled 2024-12-03: qty 15

## 2024-12-03 MED ORDER — METOPROLOL TARTRATE 25 MG/10 ML ORAL SUSPENSION: 12.5000 mg | Freq: Two times a day (BID) | ORAL | Status: DC

## 2024-12-03 MED ORDER — METOPROLOL TARTRATE 5 MG/5ML IV SOLN: 2.5000 mg | INTRAVENOUS | Status: AC | PRN

## 2024-12-03 MED ORDER — MORPHINE SULFATE (PF) 2 MG/ML IV SOLN
1.0000 mg | INTRAVENOUS | Status: DC | PRN
  Administered 2024-12-03 – 2024-12-04 (×5): 2 mg via INTRAVENOUS
  Administered 2024-12-04: 4 mg via INTRAVENOUS
  Administered 2024-12-04: 2 mg via INTRAVENOUS
  Administered 2024-12-04: 4 mg via INTRAVENOUS
  Filled 2024-12-03: qty 1
  Filled 2024-12-03: qty 2
  Filled 2024-12-03 (×3): qty 1
  Filled 2024-12-03: qty 2
  Filled 2024-12-03 (×3): qty 1

## 2024-12-03 MED ORDER — ALBUMIN HUMAN 5 % IV SOLN
250.0000 mL | INTRAVENOUS | Status: AC | PRN
  Administered 2024-12-03 – 2024-12-04 (×3): 12.5 g via INTRAVENOUS
  Filled 2024-12-03: qty 250

## 2024-12-03 MED ORDER — CHLORHEXIDINE GLUCONATE 0.12 % MT SOLN
15.0000 mL | Freq: Once | OROMUCOSAL | Status: DC
  Filled 2024-12-03: qty 15

## 2024-12-03 MED ORDER — DEXTROSE 50 % IV SOLN: 0.0000 mL | INTRAVENOUS | Status: DC | PRN

## 2024-12-03 MED ORDER — CHLORHEXIDINE GLUCONATE CLOTH 2 % EX PADS
6.0000 | MEDICATED_PAD | Freq: Every day | CUTANEOUS | Status: AC
  Administered 2024-12-03 – 2024-12-05 (×3): 6 via TOPICAL

## 2024-12-03 MED ORDER — MIDAZOLAM HCL (PF) 5 MG/ML IJ SOLN
INTRAMUSCULAR | Status: DC | PRN
  Administered 2024-12-03: 3 mg via INTRAVENOUS
  Administered 2024-12-03: 2 mg via INTRAVENOUS

## 2024-12-03 MED ORDER — LIDOCAINE 2% (20 MG/ML) 5 ML SYRINGE
INTRAMUSCULAR | Status: AC
  Filled 2024-12-03: qty 5

## 2024-12-03 MED ORDER — SODIUM CHLORIDE 0.9% FLUSH
3.0000 mL | INTRAVENOUS | Status: AC | PRN
  Administered 2024-12-04 (×2): 3 mL via INTRAVENOUS

## 2024-12-03 MED ORDER — POTASSIUM CHLORIDE 10 MEQ/50ML IV SOLN
10.0000 meq | INTRAVENOUS | Status: AC
  Administered 2024-12-03 (×3): 10 meq via INTRAVENOUS

## 2024-12-03 MED ORDER — PHENYLEPHRINE 80 MCG/ML (10ML) SYRINGE FOR IV PUSH (FOR BLOOD PRESSURE SUPPORT)
PREFILLED_SYRINGE | INTRAVENOUS | Status: DC | PRN
  Administered 2024-12-03: 80 ug via INTRAVENOUS
  Administered 2024-12-03: 40 ug via INTRAVENOUS
  Administered 2024-12-03: 80 ug via INTRAVENOUS

## 2024-12-03 MED ORDER — BISACODYL 5 MG PO TBEC
10.0000 mg | DELAYED_RELEASE_TABLET | Freq: Every day | ORAL | Status: AC
  Administered 2024-12-04 – 2024-12-05 (×2): 10 mg via ORAL
  Filled 2024-12-03 (×2): qty 2

## 2024-12-03 MED ORDER — SODIUM CHLORIDE 0.9 % IV SOLN: INTRAVENOUS | Status: AC

## 2024-12-03 MED ORDER — PROTAMINE SULFATE 10 MG/ML IV SOLN
INTRAVENOUS | Status: DC | PRN
  Administered 2024-12-03: 240 mg via INTRAVENOUS

## 2024-12-03 MED ORDER — PHENYLEPHRINE HCL-NACL 20-0.9 MG/250ML-% IV SOLN: 0.0000 ug/min | INTRAVENOUS | Status: DC

## 2024-12-03 MED ORDER — PHENYLEPHRINE 80 MCG/ML (10ML) SYRINGE FOR IV PUSH (FOR BLOOD PRESSURE SUPPORT)
PREFILLED_SYRINGE | INTRAVENOUS | Status: AC
  Filled 2024-12-03: qty 10

## 2024-12-03 MED ORDER — FENTANYL CITRATE (PF) 250 MCG/5ML IJ SOLN
INTRAMUSCULAR | Status: DC | PRN
  Administered 2024-12-03 (×3): 100 ug via INTRAVENOUS
  Administered 2024-12-03: 50 ug via INTRAVENOUS
  Administered 2024-12-03: 150 ug via INTRAVENOUS
  Administered 2024-12-03: 100 ug via INTRAVENOUS
  Administered 2024-12-03 (×2): 150 ug via INTRAVENOUS

## 2024-12-03 MED ORDER — EPHEDRINE SULFATE-NACL 50-0.9 MG/10ML-% IV SOSY
PREFILLED_SYRINGE | INTRAVENOUS | Status: DC | PRN
  Administered 2024-12-03 (×3): 5 mg via INTRAVENOUS

## 2024-12-03 MED ORDER — SODIUM CHLORIDE 0.9 % IV SOLN: 250.0000 mL | INTRAVENOUS | Status: DC

## 2024-12-03 MED ORDER — ROCURONIUM BROMIDE 10 MG/ML (PF) SYRINGE
PREFILLED_SYRINGE | INTRAVENOUS | Status: DC | PRN
  Administered 2024-12-03: 50 mg via INTRAVENOUS
  Administered 2024-12-03: 100 mg via INTRAVENOUS
  Administered 2024-12-03: 50 mg via INTRAVENOUS

## 2024-12-03 MED ORDER — ROCURONIUM BROMIDE 10 MG/ML (PF) SYRINGE
PREFILLED_SYRINGE | INTRAVENOUS | Status: AC
  Filled 2024-12-03: qty 10

## 2024-12-03 MED ORDER — ONDANSETRON HCL 4 MG/2ML IJ SOLN
4.0000 mg | Freq: Four times a day (QID) | INTRAMUSCULAR | Status: AC | PRN
  Administered 2024-12-04: 4 mg via INTRAVENOUS
  Filled 2024-12-03 (×2): qty 2

## 2024-12-03 MED ORDER — LACTATED RINGERS IV SOLN: INTRAVENOUS | Status: DC | PRN

## 2024-12-03 MED ORDER — PANTOPRAZOLE SODIUM 40 MG IV SOLR
40.0000 mg | Freq: Every day | INTRAVENOUS | Status: DC
  Administered 2024-12-03: 40 mg via INTRAVENOUS
  Filled 2024-12-03: qty 10

## 2024-12-03 MED ORDER — LACTATED RINGERS IV SOLN: INTRAVENOUS | Status: AC

## 2024-12-03 MED ORDER — INSULIN REGULAR(HUMAN) IN NACL 100-0.9 UT/100ML-% IV SOLN: INTRAVENOUS | Status: DC

## 2024-12-03 MED ORDER — LEVOTHYROXINE SODIUM 75 MCG PO TABS
75.0000 ug | ORAL_TABLET | Freq: Every day | ORAL | Status: AC
  Administered 2024-12-04 – 2024-12-05 (×2): 75 ug via ORAL
  Filled 2024-12-03 (×2): qty 1

## 2024-12-03 MED ORDER — SUGAMMADEX SODIUM 200 MG/2ML IV SOLN
INTRAVENOUS | Status: DC | PRN
  Administered 2024-12-03: 200 mg via INTRAVENOUS

## 2024-12-03 MED ORDER — ASPIRIN 81 MG PO CHEW
324.0000 mg | CHEWABLE_TABLET | Freq: Once | ORAL | Status: AC
  Administered 2024-12-03: 324 mg via ORAL
  Filled 2024-12-03: qty 4

## 2024-12-03 MED ORDER — NOREPINEPHRINE 4 MG/250ML-% IV SOLN: 0.0000 ug/min | INTRAVENOUS | Status: DC

## 2024-12-03 MED ORDER — DOCUSATE SODIUM 100 MG PO CAPS
200.0000 mg | ORAL_CAPSULE | Freq: Every day | ORAL | Status: AC
  Administered 2024-12-04 – 2024-12-05 (×2): 200 mg via ORAL
  Filled 2024-12-03 (×2): qty 2

## 2024-12-03 MED ORDER — HEPARIN SODIUM (PORCINE) 1000 UNIT/ML IJ SOLN
INTRAMUSCULAR | Status: DC | PRN
  Administered 2024-12-03: 24000 [IU] via INTRAVENOUS

## 2024-12-03 MED ORDER — PROPOFOL 10 MG/ML IV BOLUS
INTRAVENOUS | Status: AC
  Filled 2024-12-03: qty 20

## 2024-12-03 MED ORDER — EPHEDRINE 5 MG/ML INJ
INTRAVENOUS | Status: AC
  Filled 2024-12-03: qty 5

## 2024-12-03 MED ORDER — ALBUMIN HUMAN 5 % IV SOLN: INTRAVENOUS | Status: DC | PRN

## 2024-12-03 MED ORDER — OXYCODONE HCL 5 MG PO TABS
5.0000 mg | ORAL_TABLET | ORAL | Status: AC | PRN
  Administered 2024-12-03 – 2024-12-04 (×5): 10 mg via ORAL
  Filled 2024-12-03 (×5): qty 2

## 2024-12-03 MED ORDER — DEXMEDETOMIDINE HCL IN NACL 400 MCG/100ML IV SOLN: 0.0000 ug/kg/h | INTRAVENOUS | Status: DC

## 2024-12-03 MED ORDER — SODIUM CHLORIDE 0.45 % IV SOLN: INTRAVENOUS | Status: AC | PRN

## 2024-12-03 MED ORDER — 0.9 % SODIUM CHLORIDE (POUR BTL) OPTIME
TOPICAL | Status: DC | PRN
  Administered 2024-12-03: 5000 mL

## 2024-12-03 MED ORDER — HEPARIN SODIUM (PORCINE) 1000 UNIT/ML IJ SOLN
INTRAMUSCULAR | Status: AC
  Filled 2024-12-03: qty 1

## 2024-12-03 MED ORDER — LACTATED RINGERS IV SOLN: INTRAVENOUS | Status: DC

## 2024-12-03 MED ORDER — ACETAMINOPHEN 160 MG/5ML PO SOLN
650.0000 mg | Freq: Once | ORAL | Status: AC
  Administered 2024-12-03: 650 mg
  Filled 2024-12-03: qty 20.3

## 2024-12-03 MED ORDER — ACETAMINOPHEN 160 MG/5ML PO SOLN: 1000.0000 mg | Freq: Four times a day (QID) | ORAL | Status: AC

## 2024-12-03 MED ORDER — PANTOPRAZOLE SODIUM 40 MG PO TBEC
40.0000 mg | DELAYED_RELEASE_TABLET | Freq: Every day | ORAL | Status: AC
  Administered 2024-12-05: 40 mg via ORAL
  Filled 2024-12-03: qty 1

## 2024-12-03 MED ORDER — EZETIMIBE 10 MG PO TABS
10.0000 mg | ORAL_TABLET | Freq: Every day | ORAL | Status: AC
  Administered 2024-12-04 – 2024-12-05 (×2): 10 mg via ORAL
  Filled 2024-12-03 (×2): qty 1

## 2024-12-03 MED ORDER — ROSUVASTATIN CALCIUM 20 MG PO TABS
20.0000 mg | ORAL_TABLET | Freq: Every day | ORAL | Status: AC
  Administered 2024-12-04 – 2024-12-05 (×2): 20 mg via ORAL
  Filled 2024-12-03 (×2): qty 1

## 2024-12-03 MED ORDER — PROTAMINE SULFATE 10 MG/ML IV SOLN
INTRAVENOUS | Status: AC
  Filled 2024-12-03: qty 5

## 2024-12-03 MED ORDER — ACETAMINOPHEN 500 MG PO TABS
1000.0000 mg | ORAL_TABLET | Freq: Four times a day (QID) | ORAL | Status: AC
  Administered 2024-12-03 – 2024-12-05 (×7): 1000 mg via ORAL
  Filled 2024-12-03 (×7): qty 2

## 2024-12-03 MED ORDER — MIDAZOLAM HCL (PF) 2 MG/2ML IJ SOLN
2.0000 mg | INTRAMUSCULAR | Status: DC | PRN
  Filled 2024-12-03: qty 2

## 2024-12-03 MED ORDER — BISACODYL 10 MG RE SUPP: 10.0000 mg | Freq: Every day | RECTAL | Status: AC

## 2024-12-03 MED ORDER — NICARDIPINE HCL IN NACL 20-0.86 MG/200ML-% IV SOLN
0.0000 mg/h | INTRAVENOUS | Status: DC
  Administered 2024-12-03 (×2): 5 mg/h via INTRAVENOUS
  Administered 2024-12-04: 10 mg/h via INTRAVENOUS
  Administered 2024-12-04: 5 mg/h via INTRAVENOUS
  Filled 2024-12-03 (×4): qty 200

## 2024-12-03 MED ORDER — ONDANSETRON HCL 4 MG/2ML IJ SOLN
INTRAMUSCULAR | Status: DC | PRN
  Administered 2024-12-03: 4 mg via INTRAVENOUS

## 2024-12-03 MED ORDER — PROTAMINE SULFATE 10 MG/ML IV SOLN
INTRAVENOUS | Status: AC
  Filled 2024-12-03: qty 25

## 2024-12-03 MED ORDER — SODIUM CHLORIDE 0.9% FLUSH
3.0000 mL | Freq: Two times a day (BID) | INTRAVENOUS | Status: AC
  Administered 2024-12-04 – 2024-12-05 (×3): 3 mL via INTRAVENOUS

## 2024-12-03 MED ORDER — METOCLOPRAMIDE HCL 5 MG/ML IJ SOLN
10.0000 mg | Freq: Four times a day (QID) | INTRAMUSCULAR | Status: AC
  Administered 2024-12-03 – 2024-12-04 (×6): 10 mg via INTRAVENOUS
  Filled 2024-12-03 (×6): qty 2

## 2024-12-03 MED ORDER — GELATIN ABSORBABLE MT POWD
OROMUCOSAL | Status: DC | PRN
  Administered 2024-12-03: 2 via TOPICAL

## 2024-12-03 MED ORDER — PROPOFOL 10 MG/ML IV BOLUS
INTRAVENOUS | Status: DC | PRN
  Administered 2024-12-03: 120 mg via INTRAVENOUS
  Administered 2024-12-03: 40 mg via INTRAVENOUS
  Administered 2024-12-03: 30 mg via INTRAVENOUS

## 2024-12-03 MED ORDER — TRAMADOL HCL 50 MG PO TABS
50.0000 mg | ORAL_TABLET | ORAL | Status: AC | PRN
  Administered 2024-12-03: 50 mg via ORAL
  Filled 2024-12-03: qty 1

## 2024-12-03 MED ORDER — METOPROLOL TARTRATE 12.5 MG HALF TABLET
12.5000 mg | ORAL_TABLET | Freq: Two times a day (BID) | ORAL | Status: DC
  Filled 2024-12-03: qty 1

## 2024-12-03 MED ORDER — CEFAZOLIN SODIUM-DEXTROSE 2-4 GM/100ML-% IV SOLN
2.0000 g | Freq: Three times a day (TID) | INTRAVENOUS | Status: AC
  Administered 2024-12-03 – 2024-12-05 (×6): 2 g via INTRAVENOUS
  Filled 2024-12-03 (×6): qty 100

## 2024-12-03 MED ORDER — VASOPRESSIN 20 UNIT/ML IV SOLN
INTRAVENOUS | Status: AC
  Filled 2024-12-03: qty 1

## 2024-12-03 MED ORDER — ASPIRIN 325 MG PO TBEC
325.0000 mg | DELAYED_RELEASE_TABLET | Freq: Every day | ORAL | Status: AC
  Administered 2024-12-04 – 2024-12-05 (×2): 325 mg via ORAL
  Filled 2024-12-03 (×2): qty 1

## 2024-12-03 NOTE — Progress Notes (Signed)
 Night Critical Care and Cross coverage    Interval/subjective  Extubated earlier today, now on nasal cannula support.  Reporting discomfort currently at 10 out of 10 but he is now ready for pain medication.  Postextubation arterial blood gas 7.3 3/44/91/23 Current chest tube output 200 cc Hemodynamics currently pulmonary artery pressures 38/21 with cardiac index of 3.19 blood pressure at goal   Objective   BP 117/64   Pulse 64   Temp 100 F (37.8 C)   Resp 17   Ht 5' 11 (1.803 m)   Wt 75.1 kg   SpO2 98%   BMI 23.08 kg/m    PAP: (24-40)/(14-25) 32/17 CO:  [3.7 L/min-6.6 L/min] 6.2 L/min CI:  [1.92 L/min/m2-3.4 L/min/m2] 3.19 L/min/m2  I/O last 3 completed shifts: In: 4176.7 [P.O.:460; I.V.:1507.1; Blood:455; NG/GT:500; IV Piggyback:1254.6] Out: 3948 [Urine:3175; Blood:593; Chest Tube:180] Total I/O In: 159.1 [I.V.:136.6; IV Piggyback:22.5] Out: 20 [Chest Tube:20]   Exam General Pleasant 69 year old male patient lying in bed post CABG not in acute distress however is grimacing from discomfort, nursing currently getting his analgesia HEENT normocephalic atraumatic no jugular venous distention appreciated Pulmonary clear to auscultation diminished bases currently on nasal cannula support and without accessory use. Portable chest X-ray personally reviewed: Endotracheal tube in satisfactory position, this is since been removed.  Tubes and lines in satisfactory position.  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  My time 32 min included: review of most recent records, direct face to face time obtaining history, performing physical exam, developing and documenting plan as well as discussing this plan with the patient and/or care givers.  [address] on-going, stable,recovering or improving problem >25 min

## 2024-12-03 NOTE — Transfer of Care (Addendum)
 Immediate Anesthesia Transfer of Care Note  Patient: Brendan Erickson  Procedure(s) Performed: CORONARY ARTERY BYPASS GRAFTING (CABG) TIMES FOUR USING LEFT INTERNAL MAMMARY ARTERY AND ENDOSCOPICALLY HARVESTED LEFT GREATER SAPHENOUS VEIN (Chest) ECHOCARDIOGRAM, TRANSESOPHAGEAL, INTRAOPERATIVE  Patient Location: ICU  Anesthesia Type:General  Level of Consciousness: Patient remains intubated per anesthesia plan  Airway & Oxygen Therapy: Patient remains intubated per anesthesia plan and Patient placed on Ventilator (see vital sign flow sheet for setting)  Post-op Assessment: Report given to RN and Post -op Vital signs reviewed and stable  Post vital signs: Reviewed and stable  Last Vitals:  Vitals Value Taken Time  BP 114/56   Temp 35.3 C 12/03/24 14:42  Pulse 46 12/03/24 14:42  Resp 16 12/03/24 14:42  SpO2 96 % 12/03/24 14:42  Vitals shown include unfiled device data.  Last Pain:  Vitals:   12/03/24 0806  TempSrc:   PainSc: 0-No pain      Patients Stated Pain Goal: 0 (12/03/24 0806)  Complications: No notable events documented.

## 2024-12-03 NOTE — Anesthesia Procedure Notes (Signed)
 Arterial Line Insertion Start/End2/01/2025 8:25 AM, 12/03/2024 8:29 AM Performed by: Zelphia Norleen HERO, CRNA, CRNA  Patient location: Pre-op. Preanesthetic checklist: patient identified, IV checked, site marked, risks and benefits discussed, surgical consent, monitors and equipment checked, pre-op evaluation, timeout performed and anesthesia consent Lidocaine  1% used for infiltration Left, radial was placed Catheter size: 20 G Hand hygiene performed  and maximum sterile barriers used   Attempts: 1 Procedure performed using ultrasound to evaluate access site. Ultrasound Notes:relevant anatomy identified, ultrasound used to visualize needle entry and vessel patent under ultrasound. Following insertion, dressing applied. Post procedure assessment: normal and unchanged  Patient tolerated the procedure well with no immediate complications.    Media Information

## 2024-12-03 NOTE — Brief Op Note (Signed)
 12/02/2024 - 12/03/2024  9:45 AM  12:51 PM  PATIENT:  Brendan Erickson  69 y.o. male  PRE-OPERATIVE DIAGNOSIS:  Coronary Artery Disease  POST-OPERATIVE DIAGNOSIS:  Coronary Artery Disease  PROCEDURE:   CORONARY ARTERY BYPASS GRAFTING (CABG) X 4 USING LEFT INTERNAL MAMMARY ARTERY AND ENDOSCOPICALLY HARVESTED LEFT GREATER SAPHENOUS VEIN   LIMA-LAD SVG-Diag SVG-OM SVG-PDA  Vein harvest time: Vein prep time:  ECHOCARDIOGRAM, TRANSESOPHAGEAL, INTRAOPERATIVE  SURGEON:  Surgeons and Role:    * Shyrl Linnie KIDD, MD - Primary  PHYSICIAN ASSISTANT: Josua Ferrebee  ASSISTANTS: Lawernce Mariel RAMAN, RN, RN First Assistant    ANESTHESIA:   general  EBL:   BLOOD ADMINISTERED:none  DRAINS: left pleural and mediastinal drains   LOCAL MEDICATIONS USED:  NONE   COUNTS:  Correct  DICTATION: .Dragon Dictation  PLAN OF CARE: Admit to inpatient   PATIENT DISPOSITION:  ICU - intubated and hemodynamically stable.   Delay start of Pharmacological VTE agent (>24hrs) due to surgical blood loss or risk of bleeding: yes

## 2024-12-03 NOTE — Anesthesia Procedure Notes (Signed)
 Procedure Name: Intubation Date/Time: 12/03/2024 9:07 AM  Performed by: Delores Dus, CRNAPre-anesthesia Checklist: Patient identified, Emergency Drugs available, Suction available and Patient being monitored Patient Re-evaluated:Patient Re-evaluated prior to induction Oxygen Delivery Method: Circle system utilized Preoxygenation: Pre-oxygenation with 100% oxygen Induction Type: IV induction Ventilation: Mask ventilation without difficulty and Oral airway inserted - appropriate to patient size Laryngoscope Size: Mac and 2 Tube type: Oral Tube size: 8.0 mm Number of attempts: 1 Airway Equipment and Method: Stylet and Oral airway Placement Confirmation: ETT inserted through vocal cords under direct vision, positive ETCO2 and breath sounds checked- equal and bilateral Secured at: 24 cm Tube secured with: Tape Dental Injury: Teeth and Oropharynx as per pre-operative assessment

## 2024-12-03 NOTE — Op Note (Signed)
 "     [address] [place] [address]             [phone]                                     12/03/2024 Patient:  Brendan Erickson Pre-Op Dx: LM/3V CAD HTN HLP    Post-op Dx:  same Procedure: CABG X 4.  LIMA LAD, RSVG OM, PDA, Diagonal   Endoscopic greater saphenous vein harvest on the left   Surgeon and Role:      * Deserai Cansler, Linnie KIDD, MD - Primary    * EMERSON Becket , PA-C - assisting An experienced assistant was required given the complexity of this surgery and the standard of surgical care. The assistant was needed for exposure, dissection, suctioning, retraction of delicate tissues and sutures, instrument exchange and for overall help during this procedure.    Anesthesia  general EBL:  Blood Administration: none   Drains: 39 F blake drain: L, mediastinal  Wires: V Counts: correct   Indications: [name] is a very pleasant 69 year old man who presented today for outpatient cath who was found to have multivessel coronary artery disease including significant left main disease. While undergoing cath, he became unstable with bradycardia, chest pain and EKG changes. This improved with IV nitroglycerin  and TCTS was called for inpatient CABG evaluation.   Findings: Good LIMA, and vein.  Calcified distal vessels  Operative Technique: All invasive lines were placed in pre-op holding.  After the risks, benefits and alternatives were thoroughly discussed, the patient was brought to the operative theatre.  Anesthesia was induced, and the patient was prepped and draped in normal sterile fashion.  An appropriate surgical pause was performed, and pre-operative antibiotics were dosed accordingly.  We began with simultaneous incisions along the left leg for harvesting of the greater saphenous vein and the chest for the sternotomy.  In regards to the sternotomy, this was carried down with bovie cautery, and the sternum was divided with a  reciprocating saw.  Meticulous hemostasis was obtained.  The left internal thoracic artery was exposed and harvested in in pedicled fashion.  The patient was systemically heparinized, and the artery was divided distally, and placed in a papaverine sponge.    The sternal elevator was removed, and a retractor was placed.  The pericardium was divided in the midline and fashioned into a cradle with pericardial stitches.   After we confirmed an appropriate ACT, the ascending aorta was cannulated in standard fashion.  The right atrial appendage was used for venous cannulation site.  Cardiopulmonary bypass was initiated, and the heart retractor was placed. The cross clamp was applied, and a dose of anterograde cardioplegia was given with good arrest of the heart.  We moved to the posterior wall of the heart, and found a good target on the PDA.  An arteriotomy was made, and the vein graft was anastomosed to it in an end to side fashion.  Next we exposed the lateral wall, and found a good target on the OM.  An end to side anastomosis with the vein graft was then created.  Next, we exposed the anterior wall of the heart and identified a good target on Diagonal.   An arteriotomy was created.  The vein was anastomosed in an end to side fashion.  Finally, we exposed a  good target on the LAD, and fashioned an end to side anastomosis between it and the LITA.  We began to re-warm, and a re-animation dose of cardioplegia was given.  The heart was de-aired, and the cross clamp was removed.  Meticulous hemostasis was obtained.    A partial occludding clamp was then placed on the ascending aorta, and we created an end to side anastomosis between it and the proximal vein grafts.  Rings were placed on the proximal anastomosis.  Hemostasis was obtained, and we separated from cardiopulmonary bypass without event.  The heparin  was reversed with protamine .  Chest tubes and wires were placed, and the sternum was re-approximated with  sternal wires.  The soft tissue and skin were re-approximated wth absorbable suture.    The patient tolerated the procedure without any immediate complications, and was transferred to the ICU in guarded condition.  [name]  "

## 2024-12-03 NOTE — Progress Notes (Signed)
 CARDIAC REHAB PHASE I      Pre-op OHS education provided to patient and family including OHS booklet, OHS handout, IS use, mobility importance, home needs at discharge and sternal precautions/move in the tube reviewed. All questions and concerns addressed. Will continue to follow.  [id number] Isaiah JAYSON Liverpool, RN BSN 12/03/2024 7:53 AM

## 2024-12-03 NOTE — Plan of Care (Signed)
  Problem: Education: Goal: Understanding of CV disease, CV risk reduction, and recovery process will improve Outcome: Progressing Goal: Individualized Educational Video(s) Outcome: Progressing   Problem: Activity: Goal: Ability to return to baseline activity level will improve Outcome: Progressing   Problem: Cardiovascular: Goal: Ability to achieve and maintain adequate cardiovascular perfusion will improve Outcome: Progressing Goal: Vascular access site(s) Level 0-1 will be maintained Outcome: Progressing   Problem: Health Behavior/Discharge Planning: Goal: Ability to safely manage health-related needs after discharge will improve Outcome: Progressing   Problem: Education: Goal: Knowledge of General Education information will improve Description: Including pain rating scale, medication(s)/side effects and non-pharmacologic comfort measures Outcome: Progressing   Problem: Health Behavior/Discharge Planning: Goal: Ability to manage health-related needs will improve Outcome: Progressing   Problem: Clinical Measurements: Goal: Ability to maintain clinical measurements within normal limits will improve Outcome: Progressing Goal: Will remain free from infection Outcome: Progressing Goal: Diagnostic test results will improve Outcome: Progressing Goal: Respiratory complications will improve Outcome: Progressing Goal: Cardiovascular complication will be avoided Outcome: Progressing   Problem: Activity: Goal: Risk for activity intolerance will decrease Outcome: Progressing   Problem: Nutrition: Goal: Adequate nutrition will be maintained Outcome: Progressing   Problem: Coping: Goal: Level of anxiety will decrease Outcome: Progressing   Problem: Elimination: Goal: Will not experience complications related to bowel motility Outcome: Progressing Goal: Will not experience complications related to urinary retention Outcome: Progressing   Problem: Pain Managment: Goal:  General experience of comfort will improve and/or be controlled Outcome: Progressing   Problem: Safety: Goal: Ability to remain free from injury will improve Outcome: Progressing

## 2024-12-03 NOTE — Hospital Course (Signed)
 History of Present Illness:     Mr. Saint Hank is a 69 year old gentleman with past medical history notable only for dyslipidemia and hypothyroidism.  During visit with his primary care provider last fall, he discussed the symptoms of frequent fatigue and occasional chest tightness with exertion.  This prompted evaluation with a coronary CT scan that was grossly positive for coronary disease with a coronary calcium  score greater than 3700 placing him in the 98th percentile for his demographic.   Mr. Ambrocio was referred to Dr. Oneil Parchment for further cardiac evaluation and for more aggressive management of his dyslipidemia. CT FFR analysis further predicted significant stenoses in the distal left main coronary artery and mid RCA.  An echocardiogram was obtained last week showing normal biventricular function with LVEF 65 to 70% and no valvular abnormality.  Left heart catheterization was recommended and carried out earlier this morning by Dr. Ladona confirming left main and three-vessel coronary artery disease.  Mr. Schurman developed some chest discomfort during the procedure that was treated with sublingual nitroglycerin  followed by nitroglycerin  infusion.  His symptoms resolved.   Mr. Olivero is currently resting in the Cath Lab recovery area.  He denies any chest discomfort or shortness of breath.  He thinks he started having symptoms early last year and was particularly aware of his decreased endurance when he attempted to ramp up his bike riding routine during the summer.  He recently retired in December 2025.  He is left-handed.  He has no dental issues and sees a dentist every 6 months.  Dr. Shyrl and Dr. Daniel reviewed the patient's diagnostic studies and determined she would benefit from surgical intervention. The patient's treatment options as well as the risks and benefits of surgery were reviewed with the patient. Mr. Tan was agreeable to proceed with surgery.  Hospital Course: Mr. Hocevar  was admitted to the hospital and remained stable. He was taken to the operating room on 02/04 and underwent CABG x 4 utilizing LIMA to LAD, SVG to Diagonal, SVG to OM and SVG to PDA as well as endoscopic harvest of the left greater saphenous vein. He tolerated the procedure well and was taken to the SICU in stable condition. He was extubated the evening of surgery without complication. Swan ganz catheter, arterial line and epicardial pacing wires were removed on POD1 without complication. He was routinely diuresed. Chest tubes were removed without complication. He was felt stable for transfer to the progressive unit.

## 2024-12-03 NOTE — Progress Notes (Signed)
" °  Echocardiogram Echocardiogram Transesophageal has been performed.  Devora Ellouise SAUNDERS 12/03/2024, 9:43 AM "

## 2024-12-03 NOTE — Progress Notes (Signed)
 Metoprolol  held this morning, patients heart rate 55. Order to hold for heart rate under 60.

## 2024-12-03 NOTE — Progress Notes (Signed)
" °   °  [address] [place] [address]             [phone]       No events Vitals:   12/03/24 0727 12/03/24 0803  BP: (!) 149/81 (!) 184/146  Pulse: 60 66  Resp:  20  Temp: 98.2 F (36.8 C) 98.9 F (37.2 C)  SpO2: 97% 92%   Alert NAD Sinus EWOB  OR for CABG  "

## 2024-12-03 NOTE — Procedures (Signed)
 Extubation Procedure Note  Patient Details:   Name: Brendan Erickson DOB: [birth year] MRN: [id number]   Airway Documentation:    Vent end date: 12/03/24 Vent end time: 1825   Evaluation  O2 sats: stable throughout Complications: No apparent complications Patient did tolerate procedure well. Bilateral Breath Sounds: Clear, Diminished   Yes  RT extubated patient to 4L [place] per MD order and rapid wean protocol with RN bedside. Positive cuff leak noted. VC 1080 NIF -30. No stridor or distress noted at this time. RT will continue to monitor as needed.    Paulla ONEIDA Gaskins 12/03/2024, 6:54 PM

## 2024-12-03 NOTE — Anesthesia Procedure Notes (Signed)
 Central Venous Catheter Insertion Performed by: Dene Lauraine DASEN, MD, anesthesiologist Start/End2/01/2025 9:18 AM, 12/03/2024 9:18 AM Patient location: OR. Preanesthetic checklist: patient identified, IV checked, site marked, risks and benefits discussed, surgical consent, monitors and equipment checked, pre-op evaluation, timeout performed and anesthesia consent Position: Trendelenburg Patient sedated Hand hygiene performed  and maximum sterile barriers used  Catheter size: 9 Fr Total catheter length 11. Central line and PA cath was placed.MAC introducer Swan type:thermodilution Procedure performed using ultrasound to evaluate access site. Ultrasound Notes:relevant anatomy identified, ultrasound used to visualize needle entry, vessel patent under ultrasound and image(s) printed for medical record. Attempts: 1 Following insertion, line sutured and dressing applied. Post procedure assessment: blood return through all ports, free fluid flow and no air  Patient tolerated the procedure well with no immediate complications.

## 2024-12-03 NOTE — Progress Notes (Signed)
" ° °  [address] [place] [address]             [phone]   S/p CABG x 4  Extubation assessment in progress  BP 124/78   Pulse 72   Temp 100.2 F (37.9 C)   Resp 19   Ht 5' 11 (1.803 m)   Wt 75.1 kg   SpO2 97%   BMI 23.08 kg/m  42/25 CI 2.4   Intake/Output Summary (Last 24 hours) at 12/03/2024 1821 Last data filed at 12/03/2024 1800 Gross per 24 hour  Intake 3676.72 ml  Output 2788 ml  Net 888.72 ml   CT 120 ml since OR  Hct 32, K 3.9  Stable early postop  Raymar Joiner C. Kerrin, MD [facility] [phone]  "

## 2024-12-03 NOTE — Consult Note (Signed)
 "  NAME:  Brendan Erickson, MRN:  [id number], DOB:  1956-01-03, LOS: 1 ADMISSION DATE:  12/02/2024, CONSULTATION DATE:  2/4 REFERRING MD:  Shyrl MA CHIEF COMPLAINT:  s/p CABG   History of Present Illness:  69 year old male with past medical history of GERD, hyperlipidemia, CAD who presented to Dr. Jeffrie after having coronary calcium  scan on 1/27 showing severe multivessel disease with total score 3694, severe ostial LAD stenosis (98th percentile). He had echo on 1/29 showing EF 65-70%, no RWMA. Referred to TCTS for CABG.   2/3: LHC showing prox RCA 80%, distal left main and proximal LAD with hemodynamically significant stenosis.  2/4: CABG x4  Pump time: 45m  Xclamp time: 22m  EBL: 593  UOP: 975  Products: na   Pertinent  Medical History  GERD, hyperlipidemia, CAD  Significant Hospital Events: Including procedures, antibiotic start and stop dates in addition to other pertinent events   2/3: LHC  2/4: s/p CABG x 4   Interim History / Subjective:  Admit to icu s/p CABG   Objective   Blood pressure (!) 184/146, pulse 66, temperature 98.9 F (37.2 C), temperature source Oral, resp. rate 20, height 5' 11 (1.803 m), weight 75.1 kg, SpO2 92%.        Intake/Output Summary (Last 24 hours) at 12/03/2024 1212 Last data filed at 12/03/2024 1146 Gross per 24 hour  Intake 2173.15 ml  Output 1300 ml  Net 873.15 ml   Filed Weights   12/02/24 1300 12/03/24 0803 12/03/24 0806  Weight: 75.1 kg 75.1 kg 75.1 kg    Examination: General: middle aged male, post op HENT: ncat, anicteric sclera, ett, ogt  Lungs: clear bilaterally, vented and synchornous  Cardiovascular: s1s2, faint fub, no murmur, pacing wires present (not requiring), mediastinal dressing c/d/I, chest tubes with bloody output Abdomen: flat, soft  Extremities: warm, LLE harvest wrap  Neuro: sedated  GU: foley  Resolved Hospital Problem list    Assessment & Plan:  Multivessel CAD s/p CABG x4 LIMA LAD, SVG-diag, SVT-OM,  SVG-PDA - post-op management per TCTS - cbc, bmp now  - con't weaning pressors as able to maintain MAP >70-90. Albumin  boluses prn for low SV.  - avoid acidemia, coagulopathy, hypocalcemia, hypothermia - insulin  gtt for BG 100-180 - tele monitoring/ pacing prn - mediastinal drains per TCTS - multimodal pain control per protocol- oxycodone , tramadol , morphine  with bowel regimen - ASA today, statin tomorrow - metoprolol  start today  - complete post-op antibiotics - monitor electrolytes, replete PRN  Post bypass vasoplegia - wean vasopressors for MAP 70-90, albumin  prn   Post-operative vent management - rapid wean per TCTS protocol - VAP/ PPI - CXR/ ABG now  Expected post-operative ABLA Expected post-operative consumptive thrombocytopenia  - trend  - transfuse for hgb <8 or significant bleeding  - correct coagulopathy with significant bleeding    Hyperlipidemia  - zetia  and statin to begin tomorrow   GERD - ppi   Hypothyroid  - resume home synthroid  75mcg daily  Labs   CBC: Recent Labs  Lab 12/02/24 1849 12/03/24 0215 12/03/24 0955 12/03/24 1157 12/03/24 1207  WBC 7.9 7.4  --   --   --   NEUTROABS 6.2  --   --   --   --   HGB 15.3 14.3 12.2* 9.5* 9.5*  HCT 43.8 42.8 36.0* 28.0* 28.0*  MCV 86.9 88.4  --   --   --   PLT 176 161  --   --   --  Basic Metabolic Panel: Recent Labs  Lab 12/02/24 1849 12/03/24 0215 12/03/24 0955 12/03/24 1157 12/03/24 1207  NA 140 139 139 140 139  K 4.1 3.8 3.7 4.1 4.5  CL 104 103 102  --   --   CO2 25 26  --   --   --   GLUCOSE 125* 110* 134*  --   --   BUN 11 12 8   --   --   CREATININE 1.04 1.00 0.90  --   --   CALCIUM  9.2 8.9  --   --   --    GFR: Estimated Creatinine Clearance: 82.3 mL/min (by C-G formula based on SCr of 0.9 mg/dL). Recent Labs  Lab 12/02/24 1849 12/03/24 0215  WBC 7.9 7.4    Liver Function Tests: No results for input(s): AST, ALT, ALKPHOS, BILITOT, PROT, ALBUMIN  in the last 168  hours. No results for input(s): LIPASE, AMYLASE in the last 168 hours. No results for input(s): AMMONIA in the last 168 hours.  ABG    Component Value Date/Time   PHART 7.394 12/03/2024 1157   PCO2ART 40.2 12/03/2024 1157   PO2ART 345 (H) 12/03/2024 1157   HCO3 25.0 12/03/2024 1207   TCO2 26 12/03/2024 1207   O2SAT 82 12/03/2024 1207     Coagulation Profile: Recent Labs  Lab 12/02/24 1849  INR 1.0    Cardiac Enzymes: No results for input(s): CKTOTAL, CKMB, CKMBINDEX, TROPONINI in the last 168 hours.  HbA1C: Hgb A1c MFr Bld  Date/Time Value Ref Range Status  12/02/2024 06:43 PM 5.5 4.8 - 5.6 % Final    Comment:    (NOTE) Diagnosis of Diabetes The following HbA1c ranges recommended by the American Diabetes Association (ADA) may be used as an aid in the diagnosis of diabetes mellitus.  Hemoglobin             Suggested A1C NGSP%              Diagnosis  <5.7                   Non Diabetic  5.7-6.4                Pre-Diabetic  >6.4                   Diabetic  <7.0                   Glycemic control for                       adults with diabetes.      CBG: No results for input(s): GLUCAP in the last 168 hours.  Review of Systems:   As above   Past Medical History:  He,  has a past medical history of GERD (gastroesophageal reflux disease), Hyperlipidemia, and Seasonal allergies.   Surgical History:   Past Surgical History:  Procedure Laterality Date   COLONOSCOPY WITH PROPOFOL  N/A 09/12/2016   Procedure: COLONOSCOPY WITH PROPOFOL ;  Surgeon: Gladis MARLA Louder, MD;  Location: [facility] ENDOSCOPY;  Service: Endoscopy;  Laterality: N/A;   CORONARY PRESSURE/FFR WITH 3D MAPPING N/A 12/02/2024   Procedure: Coronary Pressure/FFR w/3D Mapping;  Surgeon: Ladona Heinz, MD;  Location: [facility] INVASIVE CV LAB;  Service: Cardiovascular;  Laterality: N/A;   CORONARY ULTRASOUND/IVUS N/A 12/02/2024   Procedure: Coronary Ultrasound/IVUS;  Surgeon: Ladona Heinz, MD;  Location: [facility]  INVASIVE CV LAB;  Service: Cardiovascular;  Laterality: N/A;   LEFT  HEART CATH AND CORONARY ANGIOGRAPHY N/A 12/02/2024   Procedure: LEFT HEART CATH AND CORONARY ANGIOGRAPHY;  Surgeon: Ladona Heinz, MD;  Location: [facility] INVASIVE CV LAB;  Service: Cardiovascular;  Laterality: N/A;   NASAL SINUS SURGERY  1987     Social History:   reports that he has been smoking cigars. He has never used smokeless tobacco. He reports that he does not currently use alcohol . He reports that he does not use drugs.   Family History:  His family history includes Colon polyps (age of onset: 12) in his sister; Diverticulitis in his father and sister; Prostate cancer in his father. There is no history of Colon cancer, Esophageal cancer, Stomach cancer, or Rectal cancer.   Allergies Allergies[1]   Home Medications  Prior to Admission medications  Medication Sig Start Date End Date Taking? Authorizing Provider  aspirin  EC 81 MG tablet Take 81 mg by mouth daily. Swallow whole.   Yes [provider]  ezetimibe  (ZETIA ) 10 MG tablet Take 10 mg by mouth daily. 01/06/22  Yes [provider]  levothyroxine  (SYNTHROID ) 75 MCG tablet TAKE 1 TABLET BY MOUTH EVERY DAY IN THE MORNING ON EMPTY STOMACH FOR 90 DAYS; Duration: 90   Yes [provider]  MAGNESIUM  PO Take 1 tablet by mouth at bedtime.   Yes [provider]  rosuvastatin  (CRESTOR ) 10 MG tablet Take 10 mg by mouth once a week. 08/11/16  Yes [provider]  tamsulosin  (FLOMAX ) 0.4 MG CAPS capsule Take 0.4 mg by mouth at bedtime. 08/08/24  Yes [provider]  Evolocumab  (REPATHA  SURECLICK) 140 MG/ML SOAJ Inject 140 mg into the skin every 14 (fourteen) days. Patient not taking: Reported on 11/26/2024 11/25/24   Jeffrie Oneil BROCKS, MD     Critical care time: 93   The patient is critically ill with multiple organ system failure and requires high complexity decision making for assessment and support, frequent evaluation and  titration of therapies, advanced monitoring, review of radiographic studies and interpretation of complex data.    Critical Care Time devoted to patient care services, exclusive of separately billable procedures, described in this note is 32   Tinnie FORBES Furth, PA-C [facility] 12/03/24 1:25 PM  Please see [email or web address] for pager details. From 7A-7P if no response, please call [phone]          [1] No Known Allergies  "

## 2024-12-03 NOTE — Anesthesia Preprocedure Evaluation (Signed)
 "                                  Anesthesia Evaluation  Patient identified by MRN, date of birth, ID band Patient awake    Reviewed: Allergy & Precautions, NPO status , Patient's Chart, lab work & pertinent test results, reviewed documented beta blocker date and time   History of Anesthesia Complications Negative for: history of anesthetic complications  Airway Mallampati: II  TM Distance: >3 FB Neck ROM: Full    Dental  (+) Teeth Intact, Dental Advisory Given   Pulmonary neg sleep apnea, neg recent URI, Current Smoker   breath sounds clear to auscultation       Cardiovascular (-) angina + CAD  (-) dysrhythmias (-) pacemaker(-) Cardiac Defibrillator (-) Valvular Problems/Murmurs Rhythm:Regular Rate:Normal   HLD   EKG: Sinus Bradycardia   LHC (11/2024): Hemodynamic data: LVEDP 16 mmHg.  There is no pressure gradient across the aortic valve. Angiographic data: LM: Heavily calcified.  Distal left main has a 40 to 50% stenosis, but IVUS significant measuring 3.3 mm. LAD: Diffusely diseased and heavily calcified.  Gives origin to a moderate sized D1 and a moderate to large D2.  Proximal LAD has what appears to be 30% stenosis, by IVUS significant measuring 3.8 mm area.  Mid LAD has tandem 60% and a 90% stenosis.  Proximal LAD also physiologically significant by FFR measuring 0.67. LCx: Gives origin to large OM1 with ostial 80% stenosis and a mid 60% stenosis.  Large OM 3. RCA: Heavily calcified.  Proximal RCA has a 80% stenosis followed by tandem 70 and 80% stenosis in the distal RCA.  Large PL branch with a proximal 75% stenosis.   TTE (2026): 1. Left ventricular ejection fraction, by estimation, is 65 to 70%. The left ventricle has normal function. The left ventricle has no regional wall motion abnormalities. Left ventricular diastolic parameters were normal. The average left ventricular global longitudinal strain is -20.0 %. The global longitudinal strain is  normal.   2. Right ventricular systolic function is normal. The right ventricular size is normal.   3. The mitral valve is normal in structure. No evidence of mitral valve regurgitation. No evidence of mitral stenosis.   4. The aortic valve is normal in structure. Aortic valve regurgitation is not visualized. No aortic stenosis is present.   5. The inferior vena cava is normal in size with greater than 50% respiratory variability, suggesting right atrial pressure of 3 mmHg.             Neuro/Psych neg Seizures    GI/Hepatic Neg liver ROS,GERD  Medicated and Controlled,,  Endo/Other  Hypothyroidism    Renal/GU Renal disease (Baseline Cr ~1.0 (CrCl > 70))     Musculoskeletal   Abdominal   Peds  Hematology  Hgb 14.3, Plts 161K (12/03/24)     Anesthesia Other Findings  Hx of Nasal Sinus Surgery (1980s)    Reproductive/Obstetrics                              Anesthesia Physical Anesthesia Plan  ASA: 4  Anesthesia Plan: General   Post-op Pain Management:    Induction: Intravenous  PONV Risk Score and Plan: Propofol  infusion and Treatment may vary due to age or medical condition  Airway Management Planned: Oral ETT  Additional Equipment: Arterial line, CVP, Ultrasound Guidance Line Placement  and TEE  Intra-op Plan:   Post-operative Plan: Post-operative intubation/ventilation  Informed Consent: I have reviewed the patients History and Physical, chart, labs and discussed the procedure including the risks, benefits and alternatives for the proposed anesthesia with the patient or authorized representative who has indicated his/her understanding and acceptance.     Dental advisory given  Plan Discussed with: CRNA and Surgeon  Anesthesia Plan Comments:          Anesthesia Quick Evaluation  "

## 2024-12-04 ENCOUNTER — Inpatient Hospital Stay (HOSPITAL_COMMUNITY)

## 2024-12-04 ENCOUNTER — Encounter (HOSPITAL_COMMUNITY): Payer: Self-pay | Admitting: Thoracic Surgery (Cardiothoracic Vascular Surgery)

## 2024-12-04 DIAGNOSIS — Z951 Presence of aortocoronary bypass graft: Secondary | ICD-10-CM | POA: Diagnosis not present

## 2024-12-04 DIAGNOSIS — E039 Hypothyroidism, unspecified: Secondary | ICD-10-CM | POA: Diagnosis not present

## 2024-12-04 DIAGNOSIS — I2510 Atherosclerotic heart disease of native coronary artery without angina pectoris: Secondary | ICD-10-CM | POA: Diagnosis not present

## 2024-12-04 DIAGNOSIS — J9601 Acute respiratory failure with hypoxia: Secondary | ICD-10-CM | POA: Diagnosis not present

## 2024-12-04 DIAGNOSIS — K219 Gastro-esophageal reflux disease without esophagitis: Secondary | ICD-10-CM | POA: Diagnosis not present

## 2024-12-04 LAB — POCT I-STAT 7, (LYTES, BLD GAS, ICA,H+H)
Acid-base deficit: 3 mmol/L — ABNORMAL HIGH (ref 0.0–2.0)
Acid-base deficit: 3 mmol/L — ABNORMAL HIGH (ref 0.0–2.0)
Bicarbonate: 22.5 mmol/L (ref 20.0–28.0)
Bicarbonate: 22.9 mmol/L (ref 20.0–28.0)
Calcium, Ion: 1.21 mmol/L (ref 1.15–1.40)
Calcium, Ion: 1.22 mmol/L (ref 1.15–1.40)
HCT: 32 % — ABNORMAL LOW (ref 39.0–52.0)
HCT: 32 % — ABNORMAL LOW (ref 39.0–52.0)
Hemoglobin: 10.9 g/dL — ABNORMAL LOW (ref 13.0–17.0)
Hemoglobin: 10.9 g/dL — ABNORMAL LOW (ref 13.0–17.0)
O2 Saturation: 97 %
O2 Saturation: 96 %
Patient temperature: 38
Patient temperature: 37.8
Potassium: 4.7 mmol/L (ref 3.5–5.1)
Potassium: 4.6 mmol/L (ref 3.5–5.1)
Sodium: 140 mmol/L (ref 135–145)
Sodium: 141 mmol/L (ref 135–145)
TCO2: 24 mmol/L (ref 22–32)
TCO2: 24 mmol/L (ref 22–32)
pCO2 arterial: 45.2 mmHg (ref 32–48)
pCO2 arterial: 45.4 mmHg (ref 32–48)
pH, Arterial: 7.31 — ABNORMAL LOW (ref 7.35–7.45)
pH, Arterial: 7.314 — ABNORMAL LOW (ref 7.35–7.45)
pO2, Arterial: 106 mmHg (ref 83–108)
pO2, Arterial: 96 mmHg (ref 83–108)

## 2024-12-04 LAB — BASIC METABOLIC PANEL WITH GFR
Anion gap: 7 (ref 5–15)
Anion gap: 8 (ref 5–15)
Anion gap: 4 — ABNORMAL LOW (ref 5–15)
BUN: 9 mg/dL (ref 8–23)
BUN: 10 mg/dL (ref 8–23)
BUN: 12 mg/dL (ref 8–23)
CO2: 25 mmol/L (ref 22–32)
CO2: 28 mmol/L (ref 22–32)
CO2: 31 mmol/L (ref 22–32)
Calcium: 8.1 mg/dL — ABNORMAL LOW (ref 8.9–10.3)
Calcium: 8.4 mg/dL — ABNORMAL LOW (ref 8.9–10.3)
Calcium: 8.5 mg/dL — ABNORMAL LOW (ref 8.9–10.3)
Chloride: 107 mmol/L (ref 98–111)
Chloride: 102 mmol/L (ref 98–111)
Chloride: 102 mmol/L (ref 98–111)
Creatinine, Ser: 0.92 mg/dL (ref 0.61–1.24)
Creatinine, Ser: 1.04 mg/dL (ref 0.61–1.24)
Creatinine, Ser: 0.99 mg/dL (ref 0.61–1.24)
GFR, Estimated: >60 mL/min
GFR, Estimated: >60 mL/min
GFR, Estimated: >60 mL/min
Glucose, Bld: 120 mg/dL — ABNORMAL HIGH (ref 70–99)
Glucose, Bld: 128 mg/dL — ABNORMAL HIGH (ref 70–99)
Glucose, Bld: 126 mg/dL — ABNORMAL HIGH (ref 70–99)
Potassium: 4.3 mmol/L (ref 3.5–5.1)
Potassium: 4.8 mmol/L (ref 3.5–5.1)
Potassium: 4.6 mmol/L (ref 3.5–5.1)
Sodium: 139 mmol/L (ref 135–145)
Sodium: 138 mmol/L (ref 135–145)
Sodium: 137 mmol/L (ref 135–145)

## 2024-12-04 LAB — CBC
HCT: 33.5 % — ABNORMAL LOW (ref 39.0–52.0)
HCT: 37 % — ABNORMAL LOW (ref 39.0–52.0)
Hemoglobin: 11.2 g/dL — ABNORMAL LOW (ref 13.0–17.0)
Hemoglobin: 12.7 g/dL — ABNORMAL LOW (ref 13.0–17.0)
MCH: 30.4 pg (ref 26.0–34.0)
MCH: 30.9 pg (ref 26.0–34.0)
MCHC: 33.4 g/dL (ref 30.0–36.0)
MCHC: 34.3 g/dL (ref 30.0–36.0)
MCV: 91 fL (ref 80.0–100.0)
MCV: 90 fL (ref 80.0–100.0)
Platelets: 105 10*3/uL — ABNORMAL LOW (ref 150–400)
Platelets: 121 10*3/uL — ABNORMAL LOW (ref 150–400)
RBC: 3.68 MIL/uL — ABNORMAL LOW (ref 4.22–5.81)
RBC: 4.11 MIL/uL — ABNORMAL LOW (ref 4.22–5.81)
RDW: 12.7 % (ref 11.5–15.5)
RDW: 12.8 % (ref 11.5–15.5)
WBC: 9.5 10*3/uL (ref 4.0–10.5)
WBC: 12.5 10*3/uL — ABNORMAL HIGH (ref 4.0–10.5)
nRBC: 0 % (ref 0.0–0.2)
nRBC: 0 % (ref 0.0–0.2)

## 2024-12-04 LAB — GLUCOSE, CAPILLARY
Glucose-Capillary: 119 mg/dL — ABNORMAL HIGH (ref 70–99)
Glucose-Capillary: 126 mg/dL — ABNORMAL HIGH (ref 70–99)
Glucose-Capillary: 140 mg/dL — ABNORMAL HIGH (ref 70–99)
Glucose-Capillary: 98 mg/dL (ref 70–99)
Glucose-Capillary: 114 mg/dL — ABNORMAL HIGH (ref 70–99)
Glucose-Capillary: 116 mg/dL — ABNORMAL HIGH (ref 70–99)
Glucose-Capillary: 115 mg/dL — ABNORMAL HIGH (ref 70–99)

## 2024-12-04 LAB — MAGNESIUM
Magnesium: 2.6 mg/dL — ABNORMAL HIGH (ref 1.7–2.4)
Magnesium: 2.3 mg/dL (ref 1.7–2.4)

## 2024-12-04 MED ORDER — TAMSULOSIN HCL 0.4 MG PO CAPS
0.4000 mg | ORAL_CAPSULE | Freq: Every day | ORAL | Status: AC
  Administered 2024-12-04 – 2024-12-05 (×2): 0.4 mg via ORAL
  Filled 2024-12-04 (×2): qty 1

## 2024-12-04 MED ORDER — PANTOPRAZOLE SODIUM 40 MG IV SOLR
40.0000 mg | Freq: Once | INTRAVENOUS | Status: AC
  Administered 2024-12-04: 40 mg via INTRAVENOUS
  Filled 2024-12-04: qty 10

## 2024-12-04 MED ORDER — ORAL CARE MOUTH RINSE: 15.0000 mL | OROMUCOSAL | Status: AC | PRN

## 2024-12-04 MED ORDER — FUROSEMIDE 10 MG/ML IJ SOLN
40.0000 mg | Freq: Once | INTRAMUSCULAR | Status: AC
  Administered 2024-12-04: 40 mg via INTRAVENOUS
  Filled 2024-12-04: qty 4

## 2024-12-04 MED ORDER — ENOXAPARIN SODIUM 30 MG/0.3ML IJ SOSY
30.0000 mg | PREFILLED_SYRINGE | Freq: Every day | INTRAMUSCULAR | Status: AC
  Administered 2024-12-04 – 2024-12-05 (×2): 30 mg via SUBCUTANEOUS
  Filled 2024-12-04 (×2): qty 0.3

## 2024-12-04 MED ORDER — INSULIN ASPART 100 UNIT/ML IJ SOLN: 0.0000 [IU] | INTRAMUSCULAR | Status: DC

## 2024-12-04 MED ORDER — METOPROLOL TARTRATE 25 MG PO TABS
25.0000 mg | ORAL_TABLET | Freq: Two times a day (BID) | ORAL | Status: AC
  Administered 2024-12-04 – 2024-12-05 (×3): 25 mg via ORAL
  Filled 2024-12-04 (×4): qty 1

## 2024-12-04 MED ORDER — METHOCARBAMOL 500 MG PO TABS
500.0000 mg | ORAL_TABLET | Freq: Three times a day (TID) | ORAL | Status: AC | PRN
  Administered 2024-12-04 (×2): 500 mg via ORAL
  Filled 2024-12-04 (×2): qty 1

## 2024-12-04 NOTE — Progress Notes (Signed)
" ° °   °  [address] [employer] [address]             [phone]                1 Day Post-Op Procedures (LRB): CORONARY ARTERY BYPASS GRAFTING (CABG) TIMES FOUR USING LEFT INTERNAL MAMMARY ARTERY AND ENDOSCOPICALLY HARVESTED LEFT GREATER SAPHENOUS VEIN (N/A) ECHOCARDIOGRAM, TRANSESOPHAGEAL, INTRAOPERATIVE (N/A)   Events: No events _______________________________________________________________ Vitals: BP 117/60   Pulse 63   Temp 97.9 F (36.6 C)   Resp 17   Ht 5' 11 (1.803 m)   Wt 79.8 kg   SpO2 93%   BMI 24.54 kg/m  Filed Weights   12/03/24 0803 12/03/24 0806 12/04/24 0600  Weight: 75.1 kg 75.1 kg 79.8 kg     - Neuro: alert NAD  - Cardiovascular: sinus  Drips: cardene  5.   PAP: (21-52)/(6-34) 25/7 CVP:  [0 mmHg-11 mmHg] 7 mmHg CO:  [3.7 L/min-11 L/min] 11 L/min CI:  [1.92 L/min/m2-5.64 L/min/m2] 5.64 L/min/m2  - Pulm: EWOB  ABG    Component Value Date/Time   PHART 7.314 (L) 12/03/2024 1935   PCO2ART 45.4 12/03/2024 1935   PO2ART 96 12/03/2024 1935   HCO3 22.9 12/03/2024 1935   TCO2 24 12/03/2024 1935   ACIDBASEDEF 3.0 (H) 12/03/2024 1935   O2SAT 96 12/03/2024 1935    - Abd: ND - Extremity: trace edema  .Intake/Output      02/04 0701 02/05 0700 02/05 0701 02/06 0700   P.O. 120    I.V. (mL/kg) 2900.3 (36.3) 101 (1.3)   Blood 455    Other 0    NG/GT     IV Piggyback 1927.2 0   Total Intake(mL/kg) 5402.5 (67.7) 101 (1.3)   Urine (mL/kg/hr) 2745 (1.4)    Blood 593    Chest Tube 410    Total Output 3748    Net +1654.5 +101           _______________________________________________________________ Labs:    Latest Ref Rng & Units 12/04/2024    4:09 AM 12/03/2024    8:22 PM 12/03/2024    7:35 PM  CBC  WBC 4.0 - 10.5 K/uL 9.5  9.8    Hemoglobin 13.0 - 17.0 g/dL 88.7  88.4  89.0   Hematocrit 39.0 - 52.0 % 33.5  34.1  32.0   Platelets 150 - 400 K/uL 105  119        Latest Ref Rng & Units 12/04/2024    4:09 AM 12/03/2024     7:35 PM 12/03/2024    6:20 PM  CMP  Glucose 70 - 99 mg/dL 879     BUN 8 - 23 mg/dL 9     Creatinine 9.38 - 1.24 mg/dL 9.07     Sodium 864 - 854 mmol/L 139  141  140   Potassium 3.5 - 5.1 mmol/L 4.3  4.6  4.7   Chloride 98 - 111 mmol/L 107     CO2 22 - 32 mmol/L 25     Calcium  8.9 - 10.3 mg/dL 8.1       CXR: PV congestion  _______________________________________________________________  Assessment and Plan: POD 1 s/p CABG  Neuro: pain controlled CV: removing lines and wires Pulm: IS, ambulation Renal: creat stable.  Will diures GI: on diet Heme: stable ID: [email or web address] Endo: SSI Dispo: continue ICU care   Brendan Erickson 12/04/2024 8:26 AM   "

## 2024-12-04 NOTE — Anesthesia Postprocedure Evaluation (Signed)
"   Anesthesia Post Note  Patient: Brendan Erickson  Procedure(s) Performed: CORONARY ARTERY BYPASS GRAFTING (CABG) TIMES FOUR USING LEFT INTERNAL MAMMARY ARTERY AND ENDOSCOPICALLY HARVESTED LEFT GREATER SAPHENOUS VEIN (Chest) ECHOCARDIOGRAM, TRANSESOPHAGEAL, INTRAOPERATIVE     Patient location during evaluation: ICU Anesthesia Type: General Level of consciousness: patient remains intubated per anesthesia plan Pain management: pain level controlled Vital Signs Assessment: post-procedure vital signs reviewed and stable Respiratory status: patient remains intubated per anesthesia plan Cardiovascular status: stable Anesthetic complications: no   No notable events documented.                  Lauraine DASEN Colhoun      "

## 2024-12-04 NOTE — Plan of Care (Signed)
 Progressing well POD 1. OOB x 2 to chair. Ambulate x 1. Chest tubes continue to have some drainage today. Dr. Shyrl aware.

## 2024-12-04 NOTE — Progress Notes (Signed)
 "  NAME:  LUCCA BALLO, MRN:  [id number], DOB:  [birth year], LOS: 2 ADMISSION DATE:  12/02/2024, CHIEF COMPLAINT:     History of Present Illness:  69 year old male with past medical history of GERD, hyperlipidemia, CAD who presented to Dr. Jeffrie after having coronary calcium  scan on 1/27 showing severe multivessel disease with total score 3694, severe ostial LAD stenosis (98th percentile). He had echo on 1/29 showing EF 65-70%, no RWMA. Referred to [facility] for CABG.    2/3: LHC showing prox RCA 80%, distal left main and proximal LAD with hemodynamically significant stenosis.  2/4: CABG x4   Pump time: 68m  Xclamp time: 77m  EBL: 593  UOP: 975  Products: na    Pertinent  Medical History   Past Medical History:  Diagnosis Date   GERD (gastroesophageal reflux disease)    PRN OTC meds   Hyperlipidemia    on meds   Seasonal allergies      Significant Hospital Events: Including procedures, antibiotic start and stop dates in addition to other pertinent events   12/04/2024 patient is brought to ICU postoperatively and extubated.  Interim History / Subjective:  Did well postextubation last night.  His home dose metoprolol  restarted and Cardene  was being weaned off.  He has been having some pain issues getting morphine .  In the afternoon when he took a walk he was short of breath.  He was on high flow nasal cannula but this was weaned down.  Chest x-ray did not show anything acute.   Objective    Blood pressure (!) 106/58, pulse (!) 58, temperature 98.7 F (37.1 C), temperature source Oral, resp. rate 11, height 5' 11 (1.803 m), weight 79.8 kg, SpO2 97%. PAP: (21-52)/(5-34) 33/11 CVP:  [0 mmHg-16 mmHg] 10 mmHg CO:  [4.6 L/min-11 L/min] 11 L/min CI:  [2.4 L/min/m2-5.64 L/min/m2] 5.64 L/min/m2  Vent Mode: CPAP;PSV FiO2 (%):  [40 %] 40 % Set Rate:  [4 bmp] 4 bmp PEEP:  [5 cmH20] 5 cmH20 Pressure Support:  [10 cmH20] 10 cmH20   Intake/Output Summary (Last 24 hours) at 12/04/2024  1630 Last data filed at 12/04/2024 1600 Gross per 24 hour  Intake 3242.98 ml  Output 2735 ml  Net 507.98 ml   Filed Weights   12/03/24 0803 12/03/24 0806 12/04/24 0600  Weight: 75.1 kg 75.1 kg 79.8 kg    Examination: Physical exam: General: Alert and oriented no acute issues. HEENT: [place]/AT, eyes anicteric.  moist mucus membranes Neuro: Alert, awake following commands Chest: Coarse breath sounds, no wheezes or rhonchi Heart: Regular rate and rhythm, no murmurs or gallops Abdomen: Soft, nontender, nondistended, bowel sounds present   Resolved problem list   Assessment and Plan    Multivessel CAD s/p CABG x4 LIMA LAD, SVG-diag, SVT-OM, SVG-PDA - post-op management per TCTS - cbc, bmp now  - con't weaning pressors as able to maintain MAP >70-90. Albumin  boluses prn for low SV.  - insulin  gtt is transitioned to SSI. - tele monitoring/ pacing prn - mediastinal drains per [facility]CTS - multimodal pain control per protocol- oxycodone , tramadol , morphine  with bowel regimen - Aspirin  and statin. - metoprolol  25 twice daily. - complete post-op antibiotics - monitor electrolytes, replete PRN   Hypoxia: Wean fio2 CXR did not show anything acute Bronchopulmonary hygiene - incentive spirometry, ot/pt, if needed flutter valve.    Expected post-operative ABLA Expected post-operative consumptive thrombocytopenia  - trend  - transfuse for hgb <8 or significant bleeding  - correct coagulopathy with significant bleeding  Hyperlipidemia  - zetia  and statin to begin tomorrow    GERD - ppi    Hypothyroid  - resume home synthroid  75mcg daily - insulin  gtt for BG 100-180 - tele monitoring/ pacing prn - mediastinal drains per [facility] - multimodal pain control per protocol- oxycodone , tramadol , morphine  with bowel regimen - ASA today, statin tomorrow - metoprolol    - complete post-op antibiotics - monitor electrolytes, replete PRN    Expected post-operative ABLA Expected  post-operative consumptive thrombocytopenia  - trend  - transfuse for hgb <8 or significant bleeding  - correct coagulopathy with significant bleeding     Hyperlipidemia  - zetia  and statin to begin tomorrow    GERD - ppi    Hypothyroid  - resume home synthroid  75mcg daily   Labs   CBC: Recent Labs  Lab 12/02/24 1849 12/03/24 0215 12/03/24 0955 12/03/24 1238 12/03/24 1302 12/03/24 1431 12/03/24 1438 12/03/24 1820 12/03/24 1935 12/03/24 2022 12/04/24 0409 12/04/24 1351  WBC 7.9 7.4  --   --   --  11.8*  --   --   --  9.8 9.5 12.5*  NEUTROABS 6.2  --   --   --   --   --   --   --   --   --   --   --   HGB 15.3 14.3   < > 9.5*   < > 11.3*   < > 10.9* 10.9* 11.5* 11.2* 12.7*  HCT 43.8 42.8   < > 28.3*   < > 33.2*   < > 32.0* 32.0* 34.1* 33.5* 37.0*  MCV 86.9 88.4  --   --   --  88.8  --   --   --  90.7 91.0 90.0  PLT 176 161  --  107*  --  107*  --   --   --  119* 105* 121*   < > = values in this interval not displayed.    Basic Metabolic Panel: Recent Labs  Lab 12/02/24 1849 12/03/24 0215 12/03/24 0955 12/03/24 1157 12/03/24 1230 12/03/24 1302 12/03/24 1338 12/03/24 1438 12/03/24 1820 12/03/24 1935 12/03/24 2022 12/04/24 0409  NA 140 139 139   < > 138   < > 138 140 140 141  --  139  K 4.1 3.8 3.7   < > 4.6   < > 4.3 3.9 4.7 4.6  --  4.3  CL 104 103 102  --  103  --  104  --   --   --   --  107  CO2 25 26  --   --   --   --   --   --   --   --   --  25  GLUCOSE 125* 110* 134*  --  145*  --  132*  --   --   --   --  120*  BUN 11 12 8   --  8  --  6*  --   --   --   --  9  CREATININE 1.04 1.00 0.90  --  0.80  --  0.80  --   --   --   --  0.92  CALCIUM  9.2 8.9  --   --   --   --   --   --   --   --   --  8.1*  MG  --   --   --   --   --   --   --   --   --   --  2.9* 2.6*   < > = values in this interval not displayed.   GFR: Estimated Creatinine Clearance: 80.7 mL/min (by C-G formula based on SCr of 0.92 mg/dL). Recent Labs  Lab 12/03/24 1431  12/03/24 2022 12/04/24 0409 12/04/24 1351  WBC 11.8* 9.8 9.5 12.5*    Liver Function Tests: No results for input(s): AST, ALT, ALKPHOS, BILITOT, PROT, ALBUMIN  in the last 168 hours. No results for input(s): LIPASE, AMYLASE in the last 168 hours. No results for input(s): AMMONIA in the last 168 hours.  ABG    Component Value Date/Time   PHART 7.314 (L) 12/03/2024 1935   PCO2ART 45.4 12/03/2024 1935   PO2ART 96 12/03/2024 1935   HCO3 22.9 12/03/2024 1935   TCO2 24 12/03/2024 1935   ACIDBASEDEF 3.0 (H) 12/03/2024 1935   O2SAT 96 12/03/2024 1935     Coagulation Profile: Recent Labs  Lab 12/02/24 1849 12/03/24 1431  INR 1.0 1.3*    Cardiac Enzymes: No results for input(s): CKTOTAL, CKMB, CKMBINDEX, TROPONINI in the last 168 hours.  HbA1C: Hgb A1c MFr Bld  Date/Time Value Ref Range Status  12/02/2024 06:43 PM 5.5 4.8 - 5.6 % Final    Comment:    (NOTE) Diagnosis of Diabetes The following HbA1c ranges recommended by the American Diabetes Association (ADA) may be used as an aid in the diagnosis of diabetes mellitus.  Hemoglobin             Suggested A1C NGSP%              Diagnosis  <5.7                   Non Diabetic  5.7-6.4                Pre-Diabetic  >6.4                   Diabetic  <7.0                   Glycemic control for                       adults with diabetes.      CBG: Recent Labs  Lab 12/04/24 0413 12/04/24 0603 12/04/24 0805 12/04/24 1144 12/04/24 1514  GLUCAP 126* 140* 98 114* 116*    Review of Systems:   12 point review of systems is done which is negative for anything else other than what is mentioned in subjective.  Past Medical History:  He,  has a past medical history of GERD (gastroesophageal reflux disease), Hyperlipidemia, and Seasonal allergies.   Surgical History:   Past Surgical History:  Procedure Laterality Date   COLONOSCOPY WITH PROPOFOL  N/A 09/12/2016   Procedure: COLONOSCOPY WITH  PROPOFOL ;  Surgeon: Gladis MARLA Louder, MD;  Location: [facility];  Service: Endoscopy;  Laterality: N/A;   CORONARY ARTERY BYPASS GRAFT N/A 12/03/2024   Procedure: CORONARY ARTERY BYPASS GRAFTING (CABG) TIMES FOUR USING LEFT INTERNAL MAMMARY ARTERY AND ENDOSCOPICALLY HARVESTED LEFT GREATER SAPHENOUS VEIN;  Surgeon: Shyrl Linnie KIDD, MD;  Location: [facility] OR;  Service: Open Heart Surgery;  Laterality: N/A;   CORONARY PRESSURE/FFR WITH 3D MAPPING N/A 12/02/2024   Procedure: Coronary Pressure/FFR w/3D Mapping;  Surgeon: Ladona Heinz, MD;  Location: [facility] INVASIVE CV LAB;  Service: Cardiovascular;  Laterality: N/A;   CORONARY ULTRASOUND/IVUS N/A 12/02/2024   Procedure: Coronary Ultrasound/IVUS;  Surgeon: Ladona Heinz, MD;  Location: [facility] INVASIVE CV LAB;  Service: Cardiovascular;  Laterality: N/A;   INTRAOPERATIVE TRANSESOPHAGEAL ECHOCARDIOGRAM N/A 12/03/2024   Procedure: ECHOCARDIOGRAM, TRANSESOPHAGEAL, INTRAOPERATIVE;  Surgeon: Shyrl Linnie KIDD, MD;  Location: [facility] OR;  Service: Open Heart Surgery;  Laterality: N/A;   LEFT HEART CATH AND CORONARY ANGIOGRAPHY N/A 12/02/2024   Procedure: LEFT HEART CATH AND CORONARY ANGIOGRAPHY;  Surgeon: Ladona Heinz, MD;  Location: [facility] INVASIVE CV LAB;  Service: Cardiovascular;  Laterality: N/A;   NASAL SINUS SURGERY  1987     Social History:   reports that he has been smoking cigars. He has never used smokeless tobacco. He reports that he does not currently use alcohol . He reports that he does not use drugs.   Family History:  His family history includes Colon polyps (age of onset: 65) in his sister; Diverticulitis in his father and sister; Prostate cancer in his father. There is no history of Colon cancer, Esophageal cancer, Stomach cancer, or Rectal cancer.   Allergies Allergies[1]   Home Medications  Prior to Admission medications  Medication Sig Start Date End Date Taking? Authorizing Provider  aspirin  EC 81 MG tablet Take 81 mg by mouth daily. Swallow whole.   Yes  [provider]  ezetimibe  (ZETIA ) 10 MG tablet Take 10 mg by mouth daily. 01/06/22  Yes [provider]  levothyroxine  (SYNTHROID ) 75 MCG tablet TAKE 1 TABLET BY MOUTH EVERY DAY IN THE MORNING ON EMPTY STOMACH FOR 90 DAYS; Duration: 90   Yes [provider]  MAGNESIUM  PO Take 1 tablet by mouth at bedtime.   Yes [provider]  rosuvastatin  (CRESTOR ) 10 MG tablet Take 10 mg by mouth once a week. 08/11/16  Yes [provider]  tamsulosin  (FLOMAX ) 0.4 MG CAPS capsule Take 0.4 mg by mouth at bedtime. 08/08/24  Yes [provider]  Evolocumab  (REPATHA  SURECLICK) 140 MG/ML SOAJ Inject 140 mg into the skin every 14 (fourteen) days. Patient not taking: Reported on 11/26/2024 11/25/24   Jeffrie Oneil BROCKS, MD     Critical care time: na      Tamela Stakes, MD  Attending Physician, Critical Care Medicine [facility] See Amion for pager For contact information, see Amion. If no response to pager, please call PCCM 2H APP. After hours, 7PM- 7AM, please call on call APP for 2H.           [1] No Known Allergies  "

## 2024-12-05 ENCOUNTER — Inpatient Hospital Stay (HOSPITAL_COMMUNITY)

## 2024-12-05 LAB — GLUCOSE, CAPILLARY
Glucose-Capillary: 103 mg/dL — ABNORMAL HIGH (ref 70–99)
Glucose-Capillary: 105 mg/dL — ABNORMAL HIGH (ref 70–99)
Glucose-Capillary: 113 mg/dL — ABNORMAL HIGH (ref 70–99)
Glucose-Capillary: 115 mg/dL — ABNORMAL HIGH (ref 70–99)
Glucose-Capillary: 97 mg/dL (ref 70–99)

## 2024-12-05 LAB — CBC
HCT: 32.9 % — ABNORMAL LOW (ref 39.0–52.0)
Hemoglobin: 10.8 g/dL — ABNORMAL LOW (ref 13.0–17.0)
MCH: 30.3 pg (ref 26.0–34.0)
MCHC: 32.8 g/dL (ref 30.0–36.0)
MCV: 92.4 fL (ref 80.0–100.0)
Platelets: 100 10*3/uL — ABNORMAL LOW (ref 150–400)
RBC: 3.56 MIL/uL — ABNORMAL LOW (ref 4.22–5.81)
RDW: 12.7 % (ref 11.5–15.5)
WBC: 9.7 10*3/uL (ref 4.0–10.5)
nRBC: 0 % (ref 0.0–0.2)

## 2024-12-05 LAB — BASIC METABOLIC PANEL WITH GFR
Anion gap: 7 (ref 5–15)
BUN: 14 mg/dL (ref 8–23)
CO2: 27 mmol/L (ref 22–32)
Calcium: 8.4 mg/dL — ABNORMAL LOW (ref 8.9–10.3)
Chloride: 101 mmol/L (ref 98–111)
Creatinine, Ser: 0.93 mg/dL (ref 0.61–1.24)
GFR, Estimated: >60 mL/min
Glucose, Bld: 114 mg/dL — ABNORMAL HIGH (ref 70–99)
Potassium: 4.2 mmol/L (ref 3.5–5.1)
Sodium: 136 mmol/L (ref 135–145)

## 2024-12-05 MED ORDER — POTASSIUM CHLORIDE CRYS ER 20 MEQ PO TBCR
40.0000 meq | EXTENDED_RELEASE_TABLET | Freq: Every day | ORAL | Status: AC
  Administered 2024-12-05: 40 meq via ORAL
  Filled 2024-12-05: qty 2

## 2024-12-05 MED ORDER — SODIUM CHLORIDE 0.9% FLUSH: 3.0000 mL | INTRAVENOUS | Status: DC | PRN

## 2024-12-05 MED ORDER — SODIUM CHLORIDE 0.9% FLUSH
3.0000 mL | Freq: Two times a day (BID) | INTRAVENOUS | Status: DC
  Administered 2024-12-05: 3 mL via INTRAVENOUS

## 2024-12-05 MED ORDER — FUROSEMIDE 40 MG PO TABS
40.0000 mg | ORAL_TABLET | Freq: Every day | ORAL | Status: AC
  Administered 2024-12-05: 40 mg via ORAL
  Filled 2024-12-05: qty 1

## 2024-12-05 MED ORDER — SODIUM CHLORIDE 0.9 % IV SOLN: 250.0000 mL | INTRAVENOUS | Status: DC | PRN

## 2024-12-05 MED ORDER — ~~LOC~~ CARDIAC SURGERY, PATIENT & FAMILY EDUCATION: Freq: Once | Status: AC

## 2024-12-05 MED ORDER — INSULIN ASPART 100 UNIT/ML IJ SOLN: 0.0000 [IU] | Freq: Three times a day (TID) | INTRAMUSCULAR | Status: DC

## 2024-12-05 MED ORDER — FENTANYL CITRATE (PF) 50 MCG/ML IJ SOSY
25.0000 ug | PREFILLED_SYRINGE | Freq: Once | INTRAMUSCULAR | Status: AC
  Administered 2024-12-05: 25 ug via INTRAVENOUS
  Filled 2024-12-05: qty 1

## 2024-12-05 NOTE — Progress Notes (Signed)
 CARDIAC REHAB PHASE I   Patient resting in bed and states he feels a lot better than yesterday. Patient has been ambulating to bathroom w/o endorsing CP or DOE. Promises to work on IS and ambulate again this evening. New IS brought to bedside and patient made satisfactory effort x 3. Post OHS education provided to patient and wife which included site care, restrictions, heart healthy diet, sternal precautions, IS use at home, home needs at discharge, exercise guidelines, and CRP2 reviewed. All questions and concerns addressed. Will refer to [facility] for CRP2.    3:10-3:40 Isaiah JAYSON [place], RN BSN 12/05/2024 3:59 PM

## 2024-12-05 NOTE — Progress Notes (Signed)
 12/05/2024 Doing well, in good spirits, to go to floor today.  Rolan Sharps MD PCCM

## 2024-12-05 NOTE — Discharge Summary (Incomplete)
 "      [address] [place] [address]             [phone]        Physician Discharge Summary  Patient ID: Brendan Erickson MRN: [id number] DOB/AGE: [birth year] 69 y.o.  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 Brendan Erickson is a 69 year old gentleman with past medical history notable only for dyslipidemia and hypothyroidism.  During visit with his primary care provider last fall, he discussed the symptoms of frequent fatigue and occasional chest tightness with exertion.  This prompted evaluation with a coronary CT scan that was grossly positive for coronary disease with a coronary calcium  score greater than 3700 placing him in the 98th percentile for his demographic.   Brendan Erickson was referred to Dr. Oneil Parchment for further cardiac evaluation and for more aggressive management of his dyslipidemia. CT FFR analysis further predicted significant stenoses in the distal left main coronary artery and mid RCA.  An echocardiogram was obtained last week showing normal biventricular function with LVEF 65 to 70% and no valvular abnormality.  Left heart catheterization was recommended and carried out earlier this morning by Dr. Ladona confirming left main and three-vessel coronary artery disease.  Brendan Erickson developed some chest discomfort during the procedure that was treated with sublingual nitroglycerin  followed by nitroglycerin  infusion.  His symptoms  resolved.   Brendan Erickson is currently resting in the Cath Lab recovery area.  He denies any chest discomfort or shortness of breath.  He thinks he started having symptoms early last year and was particularly aware of his decreased endurance when he attempted to ramp up his bike riding routine during the summer.  He recently retired in December 2025.  He is left-handed.  He has no dental issues and sees a dentist every 6 months.  Dr. Shyrl and Dr. Daniel reviewed the patient's diagnostic studies and determined she would benefit from surgical intervention. The patient's treatment options as well as the risks and benefits of surgery were reviewed with the patient. Brendan Erickson was agreeable to proceed with surgery.  Hospital Course: Brendan Erickson was admitted to the hospital and remained stable. He was taken to the operating room on 02/04 and underwent CABG x 4 utilizing LIMA to LAD, SVG to Diagonal, SVG to OM and SVG to PDA as well as endoscopic harvest of the left greater saphenous vein. He tolerated the procedure well and was taken to the SICU in stable condition. He was extubated the evening of surgery without complication. Swan ganz catheter, arterial line and epicardial pacing wires were removed on POD1 without complication. He was routinely diuresed. Chest tubes were removed without complication. He was felt stable for transfer to the progressive unit.   Consults: {consultation:18241}  Significant Diagnostic Studies:  LEFT HEART CATH AND CORONARY ANGIOGRAPHY  Coronary Pressure/FFR w/3D Mapping  Coronary Ultrasound/IVUS   Conclusion  Cardiac Catheterization 12/02/24: Hemodynamic data: LVEDP 16  mmHg.  There is no pressure gradient across the aortic valve.   Angiographic data: LM: Heavily calcified.  Distal left main has a 40 to 50% stenosis, but IVUS significant measuring 3.3 mm. LAD: Diffusely diseased and heavily calcified.  Gives origin to a moderate sized D1 and a moderate to large D2.   Proximal LAD has what appears to be 30% stenosis, by IVUS significant measuring 3.8 mm area.  Mid LAD has tandem 60% and a 90% stenosis.  Proximal LAD also physiologically significant by FFR measuring 0.67. LCx: Gives origin to large OM1 with ostial 80% stenosis and a mid 60% stenosis.  Large OM 3. RCA: Heavily calcified.  Proximal RCA has a 80% stenosis followed by tandem 70 and 80% stenosis in the distal RCA.  Large PL branch with a proximal 75% stenosis.     ECHOCARDIOGRAM REPORT       Patient Name:   Brendan Erickson Date of Exam: 11/27/2024  Medical Rec #:  [id number]       Height:       70.9 in  Accession #:    [id number]      Weight:       172.0 lb  Date of Birth:  [birth year]       BSA:          1.975 m  Patient Age:    69 years        BP:           165/80 mmHg  Patient Gender: M               HR:           54 bpm.  Exam Location:  [address]   Procedure: 2D Echo, 3D Echo, Cardiac Doppler, Color Doppler and Strain  Analysis            (Both Spectral and Color Flow Doppler were utilized during             procedure).   Indications:    I25.10 CAD    History:        Patient has no prior history of Echocardiogram  examinations.                 Risk Factors:HLD.    Sonographer:    Waldo Guadalajara RCS  Referring Phys: [id number] MARK C SKAINS   IMPRESSIONS     1. Left ventricular ejection fraction, by estimation, is 65 to 70%. The  left ventricle has normal function. The left ventricle has no regional  wall motion abnormalities. Left ventricular diastolic parameters were  normal. The average left ventricular  global longitudinal strain is -20.0 %. The global longitudinal strain is  normal.   2. Right ventricular systolic function is normal. The right ventricular  size is normal.   3. The mitral valve is normal in structure. No evidence of mitral valve  regurgitation. No evidence of mitral stenosis.   4. The aortic valve is normal in structure. Aortic valve regurgitation is   not visualized. No aortic stenosis is present.   5. The inferior vena cava is normal in size with greater than 50%  respiratory variability, suggesting right atrial pressure of 3 mmHg.   Comparison(s): No prior Echocardiogram.   Conclusion(s)/Recommendation(s): Normal biventricular function without  evidence of hemodynamically significant valvular heart disease.   FINDINGS   Left Ventricle: Left ventricular ejection fraction, by estimation, is 65  to 70%. The left ventricle has normal function. The left ventricle  has no  regional wall motion abnormalities. The average left ventricular global  longitudinal strain is -20.0 %.  Strain was performed and the global longitudinal strain is normal. The  left ventricular internal cavity size was normal in size. There is no left  ventricular hypertrophy. Left ventricular diastolic parameters were  normal.   Right Ventricle: The right ventricular size is normal. No increase in  right ventricular wall thickness. Right ventricular systolic function is  normal.   Left Atrium: Left atrial size was normal in size.   Right Atrium: Right atrial size was normal in size.   Pericardium: There is no evidence of pericardial effusion.   Mitral Valve: The mitral valve is normal in structure. No evidence of  mitral valve regurgitation. No evidence of mitral valve stenosis.   Tricuspid Valve: The tricuspid valve is normal in structure. Tricuspid  valve regurgitation is not demonstrated. No evidence of tricuspid  stenosis.   Aortic Valve: The aortic valve is normal in structure. Aortic valve  regurgitation is not visualized. No aortic stenosis is present.   Pulmonic Valve: The pulmonic valve was normal in structure. Pulmonic valve  regurgitation is trivial. No evidence of pulmonic stenosis.   Aorta: The aortic root is normal in size and structure.   Venous: The inferior vena cava is normal in size with greater than 50%  respiratory variability,  suggesting right atrial pressure of 3 mmHg.   IAS/Shunts: No atrial level shunt detected by color flow Doppler.   Additional Comments: 3D was performed not requiring image post processing  on an independent workstation and was normal.     LEFT VENTRICLE  PLAX 2D  LVIDd:         4.80 cm   Diastology  LVIDs:         3.00 cm   LV e' medial:    9.03 cm/s  LV PW:         1.10 cm   LV E/e' medial:  8.0  LV IVS:        1.20 cm   LV e' lateral:   14.90 cm/s  LVOT diam:     2.00 cm   LV E/e' lateral: 4.9  LV SV:         93  LV SV Index:   47        2D Longitudinal Strain  LVOT Area:     3.14 cm  2D Strain GLS (A4C):   -24.3 %  LV IVRT:       116 msec  2D Strain GLS (A3C):   -18.4 %                           2D Strain GLS (A2C):   -17.5 %                           2D Strain GLS Avg:     -20.0 %                             3D Volume EF:                           3D EF:        69 %  LV EDV:       112 ml                           LV ESV:       35 ml                           LV SV:        77 ml   RIGHT VENTRICLE  RV Basal diam:  2.90 cm     PULMONARY VEINS  RV S prime:     14.70 cm/s  A Reversal Velocity: 31.70 cm/s  TAPSE (M-mode): 2.7 cm      Diastolic Velocity:  51.30 cm/s                              S/D Velocity:        1.30                              Systolic Velocity:   68.90 cm/s   LEFT ATRIUM             Index        RIGHT ATRIUM          Index  LA diam:        4.00 cm 2.03 cm/m   RA Area:     9.98 cm  LA Vol (A2C):   48.1 ml 24.33 ml/m  RA Volume:   18.10 ml 9.16 ml/m  LA Vol (A4C):   29.2 ml 14.78 ml/m  LA Biplane Vol: 40.0 ml 20.25 ml/m   AORTIC VALVE  LVOT Vmax:   129.00 cm/s  LVOT Vmean:  78.200 cm/s  LVOT VTI:    0.296 m    AORTA  Ao Root diam: 3.60 cm  Ao Asc diam:  3.30 cm   MITRAL VALVE  MV Area (PHT):             SHUNTS  MV Decel Time:             Systemic VTI:  0.30 m  MV E velocity: 72.30 cm/s  Systemic Diam: 2.00 cm  MV A  velocity: 70.70 cm/s  MV E/A ratio:  1.02   Franck Azobou Tonleu  Electronically signed by Joelle Ren Ny  Signature Date/Time: 11/27/2024/10:01:15 AM        Final     Treatments: surgery: 12/03/2024 Patient:  Brendan Erickson Pre-Op Dx: LM/3V CAD HTN HLP    Post-op Dx:  same Procedure: CABG X 4.  LIMA LAD, RSVG OM, PDA, Diagonal   Endoscopic greater saphenous vein harvest on the left     Surgeon and Role:      * Lightfoot, Linnie KIDD, MD - Primary   Discharge Exam: Blood pressure 131/62, pulse 61, temperature 98.3 F (36.8 C), temperature source Oral, resp. rate 17, height 5' 11 (1.803 m), weight 78 kg, SpO2 (!) 85%. {physical [id number]   Discharge Medications:  The patient has been discharged on:   1.Beta Blocker:  Yes [  X ]                              No   [   ]  If No, reason:  2.Ace Inhibitor/ARB: Yes [   ]                                     No  [    ]                                     If No, reason:  3.Statin:   Yes [  X ]                  No  [   ]                  If No, reason:  4.Ecasa:  Yes  [ X  ]                  No   [   ]                  If No, reason:  Patient had ACS upon admission: No  Plavix/P2Y12 inhibitor: Yes [   ]                                      No  [   ]      Allergies as of 12/05/2024   No Known Allergies   Med Rec must be completed prior to using this Brand [facility]***       Follow-up Information     Shyrl Linnie KIDD, MD Follow up on 12/19/2024.   Specialty: Cardiothoracic Surgery Why: Virtual appointment is at 2:50PM. Please DO NOT come to the office as this is a VIRTUAL appointment. Dr. Shyrl will call you. Contact information: [address] [place] [address] [phone]         Nichole Senior, MD. Schedule an appointment as soon as possible for a visit.   Specialty: Endocrinology Why: Within 2 weeks of discharge for hospital follow  up Contact information: [address] [place] [address] [phone]         Devora Prom D, NP Follow up on 12/24/2024.   Specialty: Cardiology Why: Cardiology appointment is at 10:55AM Contact information: [address] [place] [address] [phone]                 Signed:  Con GORMAN Raguel DEVONNA  12/05/2024, 3:37 PM    "

## 2024-12-05 NOTE — Progress Notes (Signed)
" ° °   °  [address] [place] [address]             [phone]                2 Days Post-Op Procedures (LRB): CORONARY ARTERY BYPASS GRAFTING (CABG) TIMES FOUR USING LEFT INTERNAL MAMMARY ARTERY AND ENDOSCOPICALLY HARVESTED LEFT GREATER SAPHENOUS VEIN (N/A) ECHOCARDIOGRAM, TRANSESOPHAGEAL, INTRAOPERATIVE (N/A)   Events: No events _______________________________________________________________ Vitals: BP (!) 109/52   Pulse (!) 58   Temp 98.1 F (36.7 C) (Oral)   Resp 14   Ht 5' 11 (1.803 m)   Wt 78 kg   SpO2 94%   BMI 23.98 kg/m  Filed Weights   12/03/24 0806 12/04/24 0600 12/05/24 0500  Weight: 75.1 kg 79.8 kg 78 kg     - Neuro: alert NAD  - Cardiovascular: sinus  Drips:  PAP: (21-35)/(5-15) 33/11 CVP:  [4 mmHg-16 mmHg] 10 mmHg  - Pulm: EWOB  ABG    Component Value Date/Time   PHART 7.314 (L) 12/03/2024 1935   PCO2ART 45.4 12/03/2024 1935   PO2ART 96 12/03/2024 1935   HCO3 22.9 12/03/2024 1935   TCO2 24 12/03/2024 1935   ACIDBASEDEF 3.0 (H) 12/03/2024 1935   O2SAT 96 12/03/2024 1935    - Abd: ND - Extremity: trace edema  .Intake/Output      02/05 0701 02/06 0700 02/06 0701 02/07 0700   P.O. 600    I.V. (mL/kg) 359.6 (4.6)    Blood     Other     IV Piggyback 200    Total Intake(mL/kg) 1159.6 (14.9)    Urine (mL/kg/hr) 1895 (1)    Blood     Chest Tube 640    Total Output 2535    Net -1375.4            _______________________________________________________________ Labs:    Latest Ref Rng & Units 12/05/2024    4:48 AM 12/04/2024    1:51 PM 12/04/2024    4:09 AM  CBC  WBC 4.0 - 10.5 K/uL 9.7  12.5  9.5   Hemoglobin 13.0 - 17.0 g/dL 89.1  87.2  88.7   Hematocrit 39.0 - 52.0 % 32.9  37.0  33.5   Platelets 150 - 400 K/uL 100  121  105       Latest Ref Rng & Units 12/05/2024    4:48 AM 12/04/2024    8:43 PM 12/04/2024    3:19 PM  CMP  Glucose 70 - 99 mg/dL 885  873  871   BUN 8 - 23 mg/dL 14  12  10    Creatinine 0.61  - 1.24 mg/dL 9.06  9.00  8.95   Sodium 135 - 145 mmol/L 136  137  138   Potassium 3.5 - 5.1 mmol/L 4.2  4.6  4.8   Chloride 98 - 111 mmol/L 101  102  102   CO2 22 - 32 mmol/L 27  31  28    Calcium  8.9 - 10.3 mg/dL 8.4  8.5  8.4     CXR: stable  _______________________________________________________________  Assessment and Plan: POD 2 s/p CABG  Neuro: pain controlled CV: on A/S/BB Pulm: IS, ambulation Renal: creat stable.  Will diurese GI: on diet Heme: stable ID: afebrile Endo: SSI Dispo: floor   Brendan Erickson 12/05/2024 8:38 AM   "

## 2024-12-19 ENCOUNTER — Telehealth: Admitting: Thoracic Surgery (Cardiothoracic Vascular Surgery)

## 2024-12-24 ENCOUNTER — Ambulatory Visit: Admitting: Cardiology
